# Patient Record
Sex: Male | Born: 1960 | Race: Black or African American | Hispanic: No | Marital: Single | Smoking: Current every day smoker
Health system: Southern US, Community
[De-identification: ages and names within clinical notes are randomized; demographics above are authoritative.]

## PROBLEM LIST (undated history)

## (undated) DIAGNOSIS — H547 Unspecified visual loss: Secondary | ICD-10-CM

## (undated) DIAGNOSIS — E119 Type 2 diabetes mellitus without complications: Secondary | ICD-10-CM

## (undated) DIAGNOSIS — H52209 Unspecified astigmatism, unspecified eye: Secondary | ICD-10-CM

## (undated) HISTORY — DX: Unspecified astigmatism, unspecified eye: H52.209

## (undated) HISTORY — DX: Unspecified visual loss: H54.7

---

## 1997-08-11 ENCOUNTER — Emergency Department (HOSPITAL_COMMUNITY): Admission: EM | Admit: 1997-08-11 | Discharge: 1997-08-11 | Payer: Self-pay | Admitting: Emergency Medicine

## 1999-03-25 ENCOUNTER — Emergency Department (HOSPITAL_COMMUNITY): Admission: EM | Admit: 1999-03-25 | Discharge: 1999-03-25 | Payer: Self-pay | Admitting: *Deleted

## 1999-11-22 ENCOUNTER — Emergency Department (HOSPITAL_COMMUNITY): Admission: EM | Admit: 1999-11-22 | Discharge: 1999-11-23 | Payer: Self-pay | Admitting: Emergency Medicine

## 2002-04-12 ENCOUNTER — Encounter: Payer: Self-pay | Admitting: Emergency Medicine

## 2002-04-12 ENCOUNTER — Emergency Department (HOSPITAL_COMMUNITY): Admission: EM | Admit: 2002-04-12 | Discharge: 2002-04-12 | Payer: Self-pay | Admitting: Emergency Medicine

## 2003-05-30 ENCOUNTER — Ambulatory Visit (HOSPITAL_BASED_OUTPATIENT_CLINIC_OR_DEPARTMENT_OTHER): Admission: RE | Admit: 2003-05-30 | Discharge: 2003-05-30 | Payer: Self-pay | Admitting: Orthopedic Surgery

## 2005-06-17 ENCOUNTER — Emergency Department (HOSPITAL_COMMUNITY): Admission: EM | Admit: 2005-06-17 | Discharge: 2005-06-17 | Payer: Self-pay | Admitting: Emergency Medicine

## 2005-11-13 ENCOUNTER — Emergency Department (HOSPITAL_COMMUNITY): Admission: EM | Admit: 2005-11-13 | Discharge: 2005-11-13 | Payer: Self-pay | Admitting: Family Medicine

## 2006-10-01 DIAGNOSIS — H52209 Unspecified astigmatism, unspecified eye: Secondary | ICD-10-CM

## 2006-10-01 DIAGNOSIS — H547 Unspecified visual loss: Secondary | ICD-10-CM

## 2006-10-01 HISTORY — DX: Unspecified visual loss: H54.7

## 2006-10-01 HISTORY — DX: Unspecified astigmatism, unspecified eye: H52.209

## 2014-02-23 DIAGNOSIS — E119 Type 2 diabetes mellitus without complications: Secondary | ICD-10-CM

## 2014-02-23 HISTORY — DX: Type 2 diabetes mellitus without complications: E11.9

## 2014-02-26 ENCOUNTER — Encounter (HOSPITAL_COMMUNITY): Payer: Self-pay | Admitting: Cardiology

## 2014-02-26 ENCOUNTER — Emergency Department (HOSPITAL_COMMUNITY)
Admission: EM | Admit: 2014-02-26 | Discharge: 2014-02-26 | Disposition: A | Discharge status: Home / Self Care | Payer: Self-pay | Attending: Emergency Medicine | Admitting: Emergency Medicine

## 2014-02-26 DIAGNOSIS — R509 Fever, unspecified: Secondary | ICD-10-CM | POA: Insufficient documentation

## 2014-02-26 DIAGNOSIS — R739 Hyperglycemia, unspecified: Secondary | ICD-10-CM | POA: Insufficient documentation

## 2014-02-26 DIAGNOSIS — Z79899 Other long term (current) drug therapy: Secondary | ICD-10-CM | POA: Insufficient documentation

## 2014-02-26 DIAGNOSIS — R197 Diarrhea, unspecified: Secondary | ICD-10-CM | POA: Insufficient documentation

## 2014-02-26 DIAGNOSIS — Z720 Tobacco use: Secondary | ICD-10-CM | POA: Insufficient documentation

## 2014-02-26 DIAGNOSIS — R112 Nausea with vomiting, unspecified: Secondary | ICD-10-CM | POA: Insufficient documentation

## 2014-02-26 DIAGNOSIS — R1084 Generalized abdominal pain: Secondary | ICD-10-CM | POA: Insufficient documentation

## 2014-02-26 LAB — COMPREHENSIVE METABOLIC PANEL
ALT: 38 U/L (ref 0–53)
AST: 31 U/L (ref 0–37)
Albumin: 4.8 g/dL (ref 3.5–5.2)
Alkaline Phosphatase: 98 U/L (ref 39–117)
Anion gap: 15 (ref 5–15)
BUN: 14 mg/dL (ref 6–23)
CO2: 21 mmol/L (ref 19–32)
Calcium: 10 mg/dL (ref 8.4–10.5)
Chloride: 100 mEq/L (ref 96–112)
Creatinine, Ser: 1.36 mg/dL — ABNORMAL HIGH (ref 0.50–1.35)
GFR calc Af Amer: 68 mL/min — ABNORMAL LOW (ref >90)
GFR, EST NON AFRICAN AMERICAN: 59 mL/min — AB (ref >90)
Glucose, Bld: 550 mg/dL — ABNORMAL HIGH (ref 70–99)
Potassium: 4.4 mmol/L (ref 3.5–5.1)
Sodium: 136 mmol/L (ref 135–145)
Total Bilirubin: 1.3 mg/dL — ABNORMAL HIGH (ref 0.3–1.2)
Total Protein: 8.5 g/dL — ABNORMAL HIGH (ref 6.0–8.3)

## 2014-02-26 LAB — URINALYSIS, ROUTINE W REFLEX MICROSCOPIC
Bilirubin Urine: NEGATIVE
Hgb urine dipstick: NEGATIVE
Ketones, ur: 40 mg/dL — AB
LEUKOCYTES UA: NEGATIVE
Nitrite: NEGATIVE
Protein, ur: NEGATIVE mg/dL
Specific Gravity, Urine: 1.042 — ABNORMAL HIGH (ref 1.005–1.030)
Urobilinogen, UA: 0.2 mg/dL (ref 0.0–1.0)
pH: 5 (ref 5.0–8.0)

## 2014-02-26 LAB — CBC WITH DIFFERENTIAL/PLATELET
BASOS ABS: 0 10*3/uL (ref 0.0–0.1)
Basophils Relative: 0 % (ref 0–1)
EOS ABS: 0 10*3/uL (ref 0.0–0.7)
Eosinophils Relative: 0 % (ref 0–5)
HEMATOCRIT: 49.6 % (ref 39.0–52.0)
Hemoglobin: 17.9 g/dL — ABNORMAL HIGH (ref 13.0–17.0)
Lymphocytes Relative: 16 % (ref 12–46)
Lymphs Abs: 1.7 10*3/uL (ref 0.7–4.0)
MCH: 31.7 pg (ref 26.0–34.0)
MCHC: 36.1 g/dL — ABNORMAL HIGH (ref 30.0–36.0)
MCV: 87.9 fL (ref 78.0–100.0)
MONO ABS: 0.6 10*3/uL (ref 0.1–1.0)
Monocytes Relative: 6 % (ref 3–12)
Neutro Abs: 8.1 10*3/uL — ABNORMAL HIGH (ref 1.7–7.7)
Neutrophils Relative %: 78 % — ABNORMAL HIGH (ref 43–77)
Platelets: 253 10*3/uL (ref 150–400)
RBC: 5.64 MIL/uL (ref 4.22–5.81)
RDW: 12.2 % (ref 11.5–15.5)
WBC: 10.5 10*3/uL (ref 4.0–10.5)

## 2014-02-26 LAB — I-STAT CHEM 8, ED
BUN: 18 mg/dL (ref 6–23)
Calcium, Ion: 1.09 mmol/L — ABNORMAL LOW (ref 1.12–1.23)
Chloride: 101 mEq/L (ref 96–112)
Creatinine, Ser: 1.1 mg/dL (ref 0.50–1.35)
Glucose, Bld: 564 mg/dL (ref 70–99)
HEMATOCRIT: 56 % — AB (ref 39.0–52.0)
Hemoglobin: 19 g/dL — ABNORMAL HIGH (ref 13.0–17.0)
Potassium: 4.4 mmol/L (ref 3.5–5.1)
Sodium: 138 mmol/L (ref 135–145)
TCO2: 19 mmol/L (ref 0–100)

## 2014-02-26 LAB — CBG MONITORING, ED
GLUCOSE-CAPILLARY: 418 mg/dL — AB (ref 70–99)
Glucose-Capillary: 338 mg/dL — ABNORMAL HIGH (ref 70–99)
Glucose-Capillary: 347 mg/dL — ABNORMAL HIGH (ref 70–99)

## 2014-02-26 LAB — URINE MICROSCOPIC-ADD ON

## 2014-02-26 LAB — LIPASE, BLOOD: Lipase: 28 U/L (ref 11–59)

## 2014-02-26 MED ORDER — SODIUM CHLORIDE 0.9 % IV BOLUS (SEPSIS)
1000.0000 mL | Freq: Once | INTRAVENOUS | Status: AC
  Administered 2014-02-26: 1000 mL via INTRAVENOUS

## 2014-02-26 MED ORDER — ONDANSETRON HCL 4 MG/2ML IJ SOLN
4.0000 mg | Freq: Once | INTRAMUSCULAR | Status: AC
  Administered 2014-02-26: 4 mg via INTRAVENOUS
  Filled 2014-02-26: qty 2

## 2014-02-26 MED ORDER — OXYCODONE-ACETAMINOPHEN 5-325 MG PO TABS
2.0000 | ORAL_TABLET | Freq: Once | ORAL | Status: AC
  Administered 2014-02-26: 2 via ORAL
  Filled 2014-02-26: qty 2

## 2014-02-26 MED ORDER — BLOOD GLUC METER DISP-STRIPS DEVI: 1.0000 | Freq: Three times a day (TID) | Status: DC

## 2014-02-26 MED ORDER — METFORMIN HCL 500 MG PO TABS: 500.0000 mg | ORAL_TABLET | Freq: Two times a day (BID) | ORAL | Status: DC

## 2014-02-26 NOTE — ED Notes (Signed)
PA at bedside.

## 2014-02-26 NOTE — Discharge Planning (Signed)
Rokia Bosket J. Clydene Laming, RN, [place], [place] [phone] ED CM consulted to meet with patient concerning f/u care with PCP and patient does not have insurance. Pt presented to [facility] ED today with diarrhea, cough, abd pain, CBG >550.  Met with patient at bedside, confirmed informaton. Pt  reports not having access to f/u care with PCP, or insurance coverage. Discussed with patient importance and benefits of  establishing PCP, and not utilizing the ED for primary care needs. Pt verbalized understanding and is in agreement. Discussed other options, provided list of local  affordable PCPs.  Pt voiced  Interest in the [facility].  [facility] Brochure given with address, phone number, and the services highlighted. Explained that there is a Customer service manager on site who will assist with [employer] and process. Instructed to call or walk in Monday - Friday between the hours of 9- 5:30p to schedule appt for establishing care for PCP. Pt verbalized understanding.

## 2014-02-26 NOTE — ED Provider Notes (Signed)
CSN: [id number]     Arrival date & time 02/26/14  1026 History   First MD Initiated Contact with Patient 02/26/14 1052     Chief Complaint  Patient presents with  . Diarrhea  . Cough  . Abdominal Pain     (Consider location/radiation/quality/duration/timing/severity/associated sxs/prior Treatment) HPI Comments: 54 year old male with no significant past medical history presenting with generalized abdominal pain, nausea, vomiting and diarrhea 2 days. Patient describes the abdominal pain as cramping and intermittent. No specific aggravating or alleviating factors. States he has been unable to keep anything down. Admits to one episode of nonbloody diarrhea one day ago. He endorses subjective fever and chills. Patient states recently he's been drinking about two to liter sodas daily and waking up hourly at night to urinate. Denies dysuria or urgency. Denies chest pain or shortness of breath. Admits to family hx of diabetes.  Patient is a 54 y.o. male presenting with diarrhea, cough, and abdominal pain. The history is provided by the patient.  Diarrhea Associated symptoms: abdominal pain, chills, fever and vomiting   Cough Associated symptoms: chills and fever   Abdominal Pain Associated symptoms: chills, cough, diarrhea, fever, nausea and vomiting     History reviewed. No pertinent past medical history. History reviewed. No pertinent past surgical history. History reviewed. No pertinent family history. History  Substance Use Topics  . Smoking status: Current Every Day Smoker  . Smokeless tobacco: Not on file  . Alcohol Use: Yes    Review of Systems  Constitutional: Positive for fever and chills.  Respiratory: Positive for cough.   Gastrointestinal: Positive for nausea, vomiting, abdominal pain and diarrhea.  Endocrine: Positive for polydipsia and polyuria.  All other systems reviewed and are negative.     Allergies  Review of patient's allergies indicates no known  allergies.  Home Medications   Prior to Admission medications   Medication Sig Start Date End Date Taking? Authorizing Provider  aspirin-sod bicarb-citric acid (ALKA-SELTZER) 325 MG TBEF tablet Take 325 mg by mouth every 6 (six) hours as needed.   Yes Historical Provider, MD  dextromethorphan-guaiFENesin (MUCINEX DM) 30-600 MG per 12 hr tablet Take 1 tablet by mouth 2 (two) times daily.   Yes Historical Provider, MD  guaifenesin (ROBITUSSIN) 100 MG/5ML syrup Take 200 mg by mouth 3 (three) times daily as needed for cough.   Yes Historical Provider, MD  Blood Gluc Meter Disp-Strips (BLOOD GLUCOSE METER DISPOSABLE) DEVI 1 each by Does not apply route 4 (four) times daily - after meals and at bedtime. 02/26/14   Kathrynn Speed, PA-C  metFORMIN (GLUCOPHAGE) 500 MG tablet Take 1 tablet (500 mg total) by mouth 2 (two) times daily with a meal. 02/26/14   Jannifer Fischler M Jeaneen Cala, PA-C   BP 109/72 mmHg  Pulse 74  Temp(Src) 98.4 F (36.9 C) (Oral)  Resp 12  Ht  (1.676 m)  Wt 223 lb (101.152 kg)  BMI 36.01 kg/m2  SpO2 99% Physical Exam  Constitutional: He is oriented to person, place, and time. He appears well-developed and well-nourished. No distress.  HENT:  Head: Normocephalic and atraumatic.  Mouth/Throat: Oropharynx is clear and moist.  Eyes: Conjunctivae and EOM are normal. Pupils are equal, round, and reactive to light.  Neck: Normal range of motion. Neck supple.  Cardiovascular: Normal rate, regular rhythm and normal heart sounds.   Pulmonary/Chest: Effort normal and breath sounds normal.  Abdominal: Soft. Bowel sounds are normal.  Generalized abdominal tenderness. No guarding, rigidity or rebound. No peritoneal signs.  Musculoskeletal: Normal range of motion. He exhibits no edema.  Neurological: He is alert and oriented to person, place, and time.  Skin: Skin is warm and dry. He is not diaphoretic.  Psychiatric: He has a normal mood and affect. His behavior is normal.    ED Course  Procedures  (including critical care time) Labs Review Labs Reviewed  CBC WITH DIFFERENTIAL - Abnormal; Notable for the following:    Hemoglobin 17.9 (*)    MCHC 36.1 (*)    Neutrophils Relative % 78 (*)    Neutro Abs 8.1 (*)    All other components within normal limits  COMPREHENSIVE METABOLIC PANEL - Abnormal; Notable for the following:    Glucose, Bld 550 (*)    Creatinine, Ser 1.36 (*)    Total Protein 8.5 (*)    Total Bilirubin 1.3 (*)    GFR calc non Af Amer 59 (*)    GFR calc Af Amer 68 (*)    All other components within normal limits  URINALYSIS, ROUTINE W REFLEX MICROSCOPIC - Abnormal; Notable for the following:    Specific Gravity, Urine 1.042 (*)    Glucose, UA >1000 (*)    Ketones, ur 40 (*)    All other components within normal limits  CBG MONITORING, ED - Abnormal; Notable for the following:    Glucose-Capillary 418 (*)    All other components within normal limits  CBG MONITORING, ED - Abnormal; Notable for the following:    Glucose-Capillary 347 (*)    All other components within normal limits  I-STAT CHEM 8, ED - Abnormal; Notable for the following:    Glucose, Bld 564 (*)    Calcium, Ion 1.09 (*)    Hemoglobin 19.0 (*)    HCT 56.0 (*)    All other components within normal limits  CBG MONITORING, ED - Abnormal; Notable for the following:    Glucose-Capillary 338 (*)    All other components within normal limits  LIPASE, BLOOD  URINE MICROSCOPIC-ADD ON  CBG MONITORING, ED    Imaging Review No results found.   EKG Interpretation None      MDM   Final diagnoses:  Hyperglycemia  Non-intractable vomiting with nausea, vomiting of unspecified type  Diarrhea  Generalized abdominal pain   Pt in NAD. AFVSS. CBG 564 on arrival. No DKA. Abdomen soft, no peritoneal signs. CBG significant improvement after 2 L fluids to 338. Pt has been drinking two 2-L sodas daily along with drinking orange juice. I discussed dietary changes with pt as most likely new-onset DM.  Tolerates PO in ED. No emesis. Will d/c with rx for metformin, glucometer and test strips. Resources given for f/u. Return precautions given. Patient states understanding of treatment care plan and is agreeable.  Kathrynn Speed, PA-C 02/26/14 1421  Merrie Roof, MD 02/27/14 [phone]

## 2014-02-26 NOTE — ED Notes (Signed)
Tech notified this nurse that the pts glucose level was 511

## 2014-02-26 NOTE — ED Notes (Signed)
Results of chem 8 given to [place], [place]

## 2014-02-26 NOTE — Discharge Instructions (Signed)
Begin taking metformin daily as prescribed. Follow up with the wellness clinic or one of the resources below to establish care with a primary care doctor as soon as possible for further management of your elevated blood sugar.  Blood Glucose Monitoring Monitoring your blood glucose (also know as blood sugar) helps you to manage your diabetes. It also helps you and your health care provider monitor your diabetes and determine how well your treatment plan is working. WHY SHOULD YOU MONITOR YOUR BLOOD GLUCOSE?  It can help you understand how food, exercise, and medicine affect your blood glucose.  It allows you to know what your blood glucose is at any given moment. You can quickly tell if you are having low blood glucose (hypoglycemia) or high blood glucose (hyperglycemia).  It can help you and your health care provider know how to adjust your medicines.  It can help you understand how to manage an illness or adjust medicine for exercise. WHEN SHOULD YOU TEST? Your health care provider will help you decide how often you should check your blood glucose. This may depend on the type of diabetes you have, your diabetes control, or the types of medicines you are taking. Be sure to write down all of your blood glucose readings so that this information can be reviewed with your health care provider. See below for examples of testing times that your health care provider may suggest. Type 1 Diabetes  Test 4 times a day if you are in good control, using an insulin pump, or perform multiple daily injections.  If your diabetes is not well controlled or if you are sick, you may need to monitor more often.  It is a good idea to also monitor:  Before and after exercise.  Between meals and 2 hours after a meal.  Occasionally between 2:00 a.m. and 3:00 a.m. Type 2 Diabetes  It can vary with each person, but generally, if you are on insulin, test 4 times a day.  If you take medicines by mouth (orally),  test 2 times a day.  If you are on a controlled diet, test once a day.  If your diabetes is not well controlled or if you are sick, you may need to monitor more often. HOW TO MONITOR YOUR BLOOD GLUCOSE Supplies Needed  Blood glucose meter.  Test strips for your meter. Each meter has its own strips. You must use the strips that go with your own meter.  A pricking needle (lancet).  A device that holds the lancet (lancing device).  A journal or log book to write down your results. Procedure  Wash your hands with soap and water. Alcohol is not preferred.  Prick the side of your finger (not the tip) with the lancet.  Gently milk the finger until a small drop of blood appears.  Follow the instructions that come with your meter for inserting the test strip, applying blood to the strip, and using your blood glucose meter. Other Areas to Get Blood for Testing Some meters allow you to use other areas of your body (other than your finger) to test your blood. These areas are called alternative sites. The most common alternative sites are:  The forearm.  The thigh.  The back area of the lower leg.  The palm of the hand. The blood flow in these areas is slower. Therefore, the blood glucose values you get may be delayed, and the numbers are different from what you would get from your fingers. Do not use  alternative sites if you think you are having hypoglycemia. Your reading will not be accurate. Always use a finger if you are having hypoglycemia. Also, if you cannot feel your lows (hypoglycemia unawareness), always use your fingers for your blood glucose checks. ADDITIONAL TIPS FOR GLUCOSE MONITORING  Do not reuse lancets.  Always carry your supplies with you.  All blood glucose meters have a 24-hour "hotline" number to call if you have questions or need help.  Adjust (calibrate) your blood glucose meter with a control solution after finishing a few boxes of strips. BLOOD GLUCOSE  RECORD KEEPING It is a good idea to keep a daily record or log of your blood glucose readings. Most glucose meters, if not all, keep your glucose records stored in the meter. Some meters come with the ability to download your records to your home computer. Keeping a record of your blood glucose readings is especially helpful if you are wanting to look for patterns. Make notes to go along with the blood glucose readings because you might forget what happened at that exact time. Keeping good records helps you and your health care provider to work together to achieve good diabetes management.  Document Released: 02/12/2003 Document Revised: 06/26/2013 Document Reviewed: 07/04/2012 [facility] Patient Information 2015 [place], [place]. This information is not intended to replace advice given to you by your health care provider. Make sure you discuss any questions you have with your health care provider.  Diabetes and Exercise Exercising regularly is important. It is not just about losing weight. It has many health benefits, such as:  Improving your overall fitness, flexibility, and endurance.  Increasing your bone density.  Helping with weight control.  Decreasing your body fat.  Increasing your muscle strength.  Reducing stress and tension.  Improving your overall health. People with diabetes who exercise gain additional benefits because exercise:  Reduces appetite.  Improves the body's use of blood sugar (glucose).  Helps lower or control blood glucose.  Decreases blood pressure.  Helps control blood lipids (such as cholesterol and triglycerides).  Improves the body's use of the hormone insulin by:  Increasing the body's insulin sensitivity.  Reducing the body's insulin needs.  Decreases the risk for heart disease because exercising:  Lowers cholesterol and triglycerides levels.  Increases the levels of good cholesterol (such as high-density lipoproteins [HDL]) in the  body.  Lowers blood glucose levels. YOUR ACTIVITY PLAN  Choose an activity that you enjoy and set realistic goals. Your health care provider or diabetes educator can help you make an activity plan that works for you. Exercise regularly as directed by your health care provider. This includes:  Performing resistance training twice a week such as push-ups, sit-ups, lifting weights, or using resistance bands.  Performing 150 minutes of cardio exercises each week such as walking, running, or playing sports.  Staying active and spending no more than 90 minutes at one time being inactive. Even short bursts of exercise are good for you. Three 10-minute sessions spread throughout the day are just as beneficial as a single 30-minute session. Some exercise ideas include:  Taking the dog for a walk.  Taking the stairs instead of the elevator.  Dancing to your favorite song.  Doing an exercise video.  Doing your favorite exercise with a friend. RECOMMENDATIONS FOR EXERCISING WITH TYPE 1 OR TYPE 2 DIABETES   Check your blood glucose before exercising. If blood glucose levels are greater than 240 mg/dL, check for urine ketones. Do not exercise if ketones are  present.  Avoid injecting insulin into areas of the body that are going to be exercised. For example, avoid injecting insulin into:  The arms when playing tennis.  The legs when jogging.  Keep a record of:  Food intake before and after you exercise.  Expected peak times of insulin action.  Blood glucose levels before and after you exercise.  The type and amount of exercise you have done.  Review your records with your health care provider. Your health care provider will help you to develop guidelines for adjusting food intake and insulin amounts before and after exercising.  If you take insulin or oral hypoglycemic agents, watch for signs and symptoms of hypoglycemia. They  include:  Dizziness.  Shaking.  Sweating.  Chills.  Confusion.  Drink plenty of water while you exercise to prevent dehydration or heat stroke. Body water is lost during exercise and must be replaced.  Talk to your health care provider before starting an exercise program to make sure it is safe for you. Remember, almost any type of activity is better than none. Document Released: 05/02/2003 Document Revised: 06/26/2013 Document Reviewed: 07/19/2012 [facility] Patient Information 2015 [place], [place]. This information is not intended to replace advice given to you by your health care provider. Make sure you discuss any questions you have with your health care provider.  Diabetes Mellitus and Food It is important for you to manage your blood sugar (glucose) level. Your blood glucose level can be greatly affected by what you eat. Eating healthier foods in the appropriate amounts throughout the day at about the same time each day will help you control your blood glucose level. It can also help slow or prevent worsening of your diabetes mellitus. Healthy eating may even help you improve the level of your blood pressure and reach or maintain a healthy weight.  HOW CAN FOOD AFFECT ME? Carbohydrates Carbohydrates affect your blood glucose level more than any other type of food. Your dietitian will help you determine how many carbohydrates to eat at each meal and teach you how to count carbohydrates. Counting carbohydrates is important to keep your blood glucose at a healthy level, especially if you are using insulin or taking certain medicines for diabetes mellitus. Alcohol Alcohol can cause sudden decreases in blood glucose (hypoglycemia), especially if you use insulin or take certain medicines for diabetes mellitus. Hypoglycemia can be a life-threatening condition. Symptoms of hypoglycemia (sleepiness, dizziness, and disorientation) are similar to symptoms of having too much alcohol.  If your health  care provider has given you approval to drink alcohol, do so in moderation and use the following guidelines:  Women should not have more than one drink per day, and men should not have more than two drinks per day. One drink is equal to:  12 oz of beer.  5 oz of wine.  1 oz of hard liquor.  Do not drink on an empty stomach.  Keep yourself hydrated. Have water, diet soda, or unsweetened iced tea.  Regular soda, juice, and other mixers might contain a lot of carbohydrates and should be counted. WHAT FOODS ARE NOT RECOMMENDED? As you make food choices, it is important to remember that all foods are not the same. Some foods have fewer nutrients per serving than other foods, even though they might have the same number of calories or carbohydrates. It is difficult to get your body what it needs when you eat foods with fewer nutrients. Examples of foods that you should avoid that are high  in calories and carbohydrates but low in nutrients include:  Trans fats (most processed foods list trans fats on the Nutrition Facts label).  Regular soda.  Juice.  Candy.  Sweets, such as cake, pie, doughnuts, and cookies.  Fried foods. WHAT FOODS CAN I EAT? Have nutrient-rich foods, which will nourish your body and keep you healthy. The food you should eat also will depend on several factors, including:  The calories you need.  The medicines you take.  Your weight.  Your blood glucose level.  Your blood pressure level.  Your cholesterol level. You also should eat a variety of foods, including:  Protein, such as meat, poultry, fish, tofu, nuts, and seeds (lean animal proteins are best).  Fruits.  Vegetables.  Dairy products, such as milk, cheese, and yogurt (low fat is best).  Breads, grains, pasta, cereal, rice, and beans.  Fats such as olive oil, trans fat-free margarine, canola oil, avocado, and olives. DOES EVERYONE WITH DIABETES MELLITUS HAVE THE SAME MEAL PLAN? Because every  person with diabetes mellitus is different, there is not one meal plan that works for everyone. It is very important that you meet with a dietitian who will help you create a meal plan that is just right for you. Document Released: 11/06/2004 Document Revised: 02/14/2013 Document Reviewed: 01/06/2013 [facility] Patient Information 2015 [place], [place]. This information is not intended to replace advice given to you by your health care provider. Make sure you discuss any questions you have with your health care provider.  Hyperglycemia Hyperglycemia occurs when the glucose (sugar) in your blood is too high. Hyperglycemia can happen for many reasons, but it most often happens to people who do not know they have diabetes or are not managing their diabetes properly.  CAUSES  Whether you have diabetes or not, there are other causes of hyperglycemia. Hyperglycemia can occur when you have diabetes, but it can also occur in other situations that you might not be as aware of, such as: Diabetes  If you have diabetes and are having problems controlling your blood glucose, hyperglycemia could occur because of some of the following reasons:  Not following your meal plan.  Not taking your diabetes medications or not taking it properly.  Exercising less or doing less activity than you normally do.  Being sick. Pre-diabetes  This cannot be ignored. Before people develop Type 2 diabetes, they almost always have "pre-diabetes." This is when your blood glucose levels are higher than normal, but not yet high enough to be diagnosed as diabetes. Research has shown that some long-term damage to the body, especially the heart and circulatory system, may already be occurring during pre-diabetes. If you take action to manage your blood glucose when you have pre-diabetes, you may delay or prevent Type 2 diabetes from developing. Stress  If you have diabetes, you may be "diet" controlled or on oral medications or insulin  to control your diabetes. However, you may find that your blood glucose is higher than usual in the hospital whether you have diabetes or not. This is often referred to as "stress hyperglycemia." Stress can elevate your blood glucose. This happens because of hormones put out by the body during times of stress. If stress has been the cause of your high blood glucose, it can be followed regularly by your caregiver. That way he/she can make sure your hyperglycemia does not continue to get worse or progress to diabetes. Steroids  Steroids are medications that act on the infection fighting system (immune system)  to block inflammation or infection. One side effect can be a rise in blood glucose. Most people can produce enough extra insulin to allow for this rise, but for those who cannot, steroids make blood glucose levels go even higher. It is not unusual for steroid treatments to "uncover" diabetes that is developing. It is not always possible to determine if the hyperglycemia will go away after the steroids are stopped. A special blood test called an A1c is sometimes done to determine if your blood glucose was elevated before the steroids were started. SYMPTOMS  Thirsty.  Frequent urination.  Dry mouth.  Blurred vision.  Tired or fatigue.  Weakness.  Sleepy.  Tingling in feet or leg. DIAGNOSIS  Diagnosis is made by monitoring blood glucose in one or all of the following ways:  A1c test. This is a chemical found in your blood.  Fingerstick blood glucose monitoring.  Laboratory results. TREATMENT  First, knowing the cause of the hyperglycemia is important before the hyperglycemia can be treated. Treatment may include, but is not be limited to:  Education.  Change or adjustment in medications.  Change or adjustment in meal plan.  Treatment for an illness, infection, etc.  More frequent blood glucose monitoring.  Change in exercise plan.  Decreasing or stopping  steroids.  Lifestyle changes. HOME CARE INSTRUCTIONS   Test your blood glucose as directed.  Exercise regularly. Your caregiver will give you instructions about exercise. Pre-diabetes or diabetes which comes on with stress is helped by exercising.  Eat wholesome, balanced meals. Eat often and at regular, fixed times. Your caregiver or nutritionist will give you a meal plan to guide your sugar intake.  Being at an ideal weight is important. If needed, losing as little as 10 to 15 pounds may help improve blood glucose levels. SEEK MEDICAL CARE IF:   You have questions about medicine, activity, or diet.  You continue to have symptoms (problems such as increased thirst, urination, or weight gain). SEEK IMMEDIATE MEDICAL CARE IF:   You are vomiting or have diarrhea.  Your breath smells fruity.  You are breathing faster or slower.  You are very sleepy or incoherent.  You have numbness, tingling, or pain in your feet or hands.  You have chest pain.  Your symptoms get worse even though you have been following your caregiver's orders.  If you have any other questions or concerns. Document Released: 08/05/2000 Document Revised: 05/04/2011 Document Reviewed: 06/08/2011 [facility] Patient Information 2015 [place], [place]. This information is not intended to replace advice given to you by your health care provider. Make sure you discuss any questions you have with your health care provider. RESOURCE GUIDE  Chronic Pain Problems: Contact [facility]  [phone] Patients need to be referred by their primary care doctor.  Insufficient Money for Medicine: Contact [employer]:  call "211."   No Primary Care Doctor: - Call [facility]  [phone] - can help you locate a primary care doctor that  accepts your insurance, provides certain services, etc. - Physician Referral [phone]  Agencies that provide inexpensive medical care: - [facility]  [phone] - [facility]  [phone] - [facility]  [phone] - [facility]  [phone] - [facility]  [phone] - [facility]  [phone]  Medicaid-accepting [facility] Providers: - [facility]- [address]  [phone], Mon-Fri 9am-7pm, Sat 9am-1pm - [facility]- [address] [place], [address]  [phone] - [facility]- [address], [address] [place]  [phone] [facility]- [address]  [phone] - Renaye Rakers- [address] [place], [address], [phone]  Only accepts [place] patients after they have their name  applied to their card  Self Pay (no insurance) in [facility]: - Sickle Cell Patients: Dr Willey Blade, [facility]  [address] [place], [phone] - [facility]- [address] [place]  [phone]       [facility] [place] [facility]- [address]       -     [facility]- see information above (Speak to [employer] if you do not have insurance)       -  [facility]- [address],  [phone]       -  [facility]- [address], [phone]       -  Dr Julio Sicks-  [address], [place], [phone]       -  [facility] - [address] [phone]       -  [facility]- [address] [phone], also [address], [phone]       -    [facility]- [address] [place], [phone], 1st & 3rd Saturday        every month, 10am-1pm  1) Find a Doctor and Pay Out of Pocket Although you won't have to find out who is covered by your insurance plan, it is a good idea to ask around and get recommendations. You will then need to call the office and see if the doctor you have chosen will accept you as a new patient and what types of options they offer for patients who are  self-pay. Some doctors offer discounts or will set up payment plans for their patients who do not have insurance, but you will need to ask so you aren't surprised when you get to your appointment.  2) Contact Your Local Health Department Not all health departments have doctors that can see patients for sick visits, but many do, so it is worth a call to see if yours does. If you don't know where your local health department is, you can check in your phone book. The CDC also has a tool to help you locate your state's health department, and many state websites also have listings of all of their local health departments.  3) Find a Walk-in Clinic If your illness is not likely to be very severe or complicated, you may want to try a walk in clinic. These are popping up all over the country in pharmacies, drugstores, and shopping centers. They're usually staffed by nurse practitioners or physician assistants that have been trained to treat common illnesses and complaints. They're usually fairly quick and inexpensive. However, if you have serious medical issues or chronic medical problems, these are probably not your best option

## 2014-02-26 NOTE — ED Notes (Signed)
Pt given urinal.

## 2014-02-26 NOTE — ED Notes (Signed)
Pt reports that he has had n/v/d for the past couple of days. Reports that he has also increased fluid intake. Pt reports a hx of DM in his family.

## 2014-02-26 NOTE — ED Notes (Signed)
Robyn, PA at the bedside.  

## 2014-03-01 ENCOUNTER — Emergency Department (HOSPITAL_COMMUNITY)
Admission: EM | Admit: 2014-03-01 | Discharge: 2014-03-01 | Disposition: A | Discharge status: Home / Self Care | Payer: Self-pay | Attending: Emergency Medicine | Admitting: Emergency Medicine

## 2014-03-01 ENCOUNTER — Encounter (HOSPITAL_COMMUNITY): Payer: Self-pay | Admitting: Cardiology

## 2014-03-01 DIAGNOSIS — E119 Type 2 diabetes mellitus without complications: Secondary | ICD-10-CM | POA: Insufficient documentation

## 2014-03-01 DIAGNOSIS — Z720 Tobacco use: Secondary | ICD-10-CM | POA: Insufficient documentation

## 2014-03-01 DIAGNOSIS — R197 Diarrhea, unspecified: Secondary | ICD-10-CM | POA: Insufficient documentation

## 2014-03-01 DIAGNOSIS — R1310 Dysphagia, unspecified: Secondary | ICD-10-CM | POA: Insufficient documentation

## 2014-03-01 DIAGNOSIS — R110 Nausea: Secondary | ICD-10-CM | POA: Insufficient documentation

## 2014-03-01 HISTORY — DX: Type 2 diabetes mellitus without complications: E11.9

## 2014-03-01 LAB — CBC WITH DIFFERENTIAL/PLATELET
BASOS ABS: 0 10*3/uL (ref 0.0–0.1)
BASOS PCT: 0 % (ref 0–1)
Eosinophils Absolute: 0.1 10*3/uL (ref 0.0–0.7)
Eosinophils Relative: 1 % (ref 0–5)
HCT: 44.5 % (ref 39.0–52.0)
Hemoglobin: 16.1 g/dL (ref 13.0–17.0)
LYMPHS PCT: 24 % (ref 12–46)
Lymphs Abs: 1.8 10*3/uL (ref 0.7–4.0)
MCH: 32.3 pg (ref 26.0–34.0)
MCHC: 36.2 g/dL — AB (ref 30.0–36.0)
MCV: 89.4 fL (ref 78.0–100.0)
Monocytes Absolute: 0.6 10*3/uL (ref 0.1–1.0)
Monocytes Relative: 8 % (ref 3–12)
NEUTROS ABS: 5 10*3/uL (ref 1.7–7.7)
NEUTROS PCT: 67 % (ref 43–77)
PLATELETS: 216 10*3/uL (ref 150–400)
RBC: 4.98 MIL/uL (ref 4.22–5.81)
RDW: 12.2 % (ref 11.5–15.5)
WBC: 7.5 10*3/uL (ref 4.0–10.5)

## 2014-03-01 LAB — COMPREHENSIVE METABOLIC PANEL
ALK PHOS: 82 U/L (ref 39–117)
ALT: 25 U/L (ref 0–53)
ANION GAP: 11 (ref 5–15)
AST: 23 U/L (ref 0–37)
Albumin: 4.1 g/dL (ref 3.5–5.2)
BUN: 10 mg/dL (ref 6–23)
CALCIUM: 9.3 mg/dL (ref 8.4–10.5)
CO2: 24 mmol/L (ref 19–32)
Chloride: 99 mEq/L (ref 96–112)
Creatinine, Ser: 1.08 mg/dL (ref 0.50–1.35)
GFR calc non Af Amer: 78 mL/min — ABNORMAL LOW (ref >90)
GLUCOSE: 384 mg/dL — AB (ref 70–99)
POTASSIUM: 3.8 mmol/L (ref 3.5–5.1)
Sodium: 134 mmol/L — ABNORMAL LOW (ref 135–145)
Total Bilirubin: 0.8 mg/dL (ref 0.3–1.2)
Total Protein: 7 g/dL (ref 6.0–8.3)

## 2014-03-01 LAB — LIPASE, BLOOD: Lipase: 39 U/L (ref 11–59)

## 2014-03-01 LAB — CBG MONITORING, ED
Glucose-Capillary: 373 mg/dL — ABNORMAL HIGH (ref 70–99)
Glucose-Capillary: 222 mg/dL — ABNORMAL HIGH (ref 70–99)
Glucose-Capillary: 243 mg/dL — ABNORMAL HIGH (ref 70–99)
Glucose-Capillary: 278 mg/dL — ABNORMAL HIGH (ref 70–99)

## 2014-03-01 MED ORDER — GI COCKTAIL ~~LOC~~
30.0000 mL | Freq: Once | ORAL | Status: AC
  Administered 2014-03-01: 30 mL via ORAL
  Filled 2014-03-01: qty 30

## 2014-03-01 MED ORDER — ONDANSETRON HCL 4 MG PO TABS: 4.0000 mg | ORAL_TABLET | Freq: Four times a day (QID) | ORAL | Status: DC

## 2014-03-01 MED ORDER — HYDROCODONE-ACETAMINOPHEN 5-325 MG PO TABS
2.0000 | ORAL_TABLET | Freq: Once | ORAL | Status: AC
  Administered 2014-03-01: 2 via ORAL
  Filled 2014-03-01: qty 2

## 2014-03-01 MED ORDER — INSULIN ASPART 100 UNIT/ML IV SOLN
8.0000 [IU] | Freq: Once | INTRAVENOUS | Status: AC
  Administered 2014-03-01: 8 [IU] via INTRAVENOUS
  Filled 2014-03-01: qty 1

## 2014-03-01 MED ORDER — OMEPRAZOLE 20 MG PO CPDR: 20.0000 mg | DELAYED_RELEASE_CAPSULE | Freq: Every day | ORAL | Status: DC

## 2014-03-01 MED ORDER — SODIUM CHLORIDE 0.9 % IV BOLUS (SEPSIS)
1000.0000 mL | Freq: Once | INTRAVENOUS | Status: AC
  Administered 2014-03-01: 1000 mL via INTRAVENOUS

## 2014-03-01 NOTE — Discharge Instructions (Signed)
Is important for you to follow-up with [place] [facility] for your regularly scheduled appointment on Thursday. Please take your medications as prescribed. It is important for you to get close follow-up for your blood sugar. Return to ED for worsening symptoms, abdominal pain, nausea, vomiting

## 2014-03-01 NOTE — ED Notes (Signed)
Pt states he saw "bright blood in my stool earlier today and i also saw blood in my throw up."

## 2014-03-01 NOTE — ED Notes (Signed)
Pt reports that he has had a sore throat for 2 days and has a burning sensation in his throat when he swallows and feels like the food gets stuck in his chest.

## 2014-03-01 NOTE — ED Provider Notes (Signed)
CSN: [id number]     Arrival date & time 03/01/14  1535 History   First MD Initiated Contact with Patient 03/01/14 1740     Chief Complaint  Patient presents with  . Sore Throat  . Gastrophageal Reflux     (Consider location/radiation/quality/duration/timing/severity/associated sxs/prior Treatment) HPI Matthew Montgomery is a 54 y.o. male with recently diagnosed diabetes comes in for evaluation of difficulty swallowing, nausea and diarrhea for the past 3 days. Patient states since he started metformin 3 days ago he has had increased nausea and diarrhea with some discomfort in his upper abdomen. When he tries to swallow his food he gets sensations that they get stuck in his throat and at the bottom of his chest. He is, however, able to swallow. He also reports when he clears his throat really hard to get mucous out he will sometimes see streaks of blood. Patient also reports that he will drink 40 ounces of beer every other day. Denies fevers, headaches, cp, sob, numbness, weakness, dizziness.  Past Medical History  Diagnosis Date  . Diabetes mellitus without complication    History reviewed. No pertinent past surgical history. History reviewed. No pertinent family history. History  Substance Use Topics  . Smoking status: Current Every Day Smoker  . Smokeless tobacco: Not on file  . Alcohol Use: Yes    Review of Systems A 10 point review of systems was completed and was negative except for pertinent positives and negatives as mentioned in the history of present illness     Allergies  Review of patient's allergies indicates no known allergies.  Home Medications   Prior to Admission medications   Medication Sig Start Date End Date Taking? Authorizing Provider  metFORMIN (GLUCOPHAGE) 500 MG tablet Take 1 tablet (500 mg total) by mouth 2 (two) times daily with a meal. 02/26/14  Yes Robyn M Hess, PA-C  omeprazole (PRILOSEC) 20 MG capsule Take 1 capsule (20 mg total) by mouth daily. 03/01/14    Earle GellBenjamin W Anthoni Geerts, PA-C  ondansetron (ZOFRAN) 4 MG tablet Take 1 tablet (4 mg total) by mouth every 6 (six) hours. 03/01/14   Earle GellBenjamin W Markella Dao, PA-C   BP 114/56 mmHg  Pulse 81  Temp(Src) 98.1 F (36.7 C) (Oral)  Resp 19  SpO2 99% Physical Exam  Constitutional: He is oriented to person, place, and time. He appears well-developed and well-nourished.  HENT:  Head: Normocephalic and atraumatic.  Mouth/Throat: Oropharynx is clear and moist.  Eyes: Conjunctivae are normal. Pupils are equal, round, and reactive to light. Right eye exhibits no discharge. Left eye exhibits no discharge. No scleral icterus.  Neck: Neck supple. No tracheal deviation present.  Cardiovascular: Normal rate, regular rhythm, normal heart sounds and intact distal pulses.   Pulmonary/Chest: Effort normal and breath sounds normal. No respiratory distress. He has no wheezes. He has no rales.  Abdominal: Soft. He exhibits no distension and no mass. There is no tenderness. There is no rebound and no guarding.  Musculoskeletal: Normal range of motion. He exhibits no edema or tenderness.  Neurological: He is alert and oriented to person, place, and time.  Cranial Nerves II-XII grossly intact  Skin: Skin is warm and dry. No rash noted.  Psychiatric: He has a normal mood and affect.  Nursing note and vitals reviewed.   ED Course  Procedures (including critical care time) Labs Review Labs Reviewed  COMPREHENSIVE METABOLIC PANEL - Abnormal; Notable for the following:    Sodium 134 (*)    Glucose, Bld 384 (*)  GFR calc non Af Amer 78 (*)    All other components within normal limits  CBC WITH DIFFERENTIAL - Abnormal; Notable for the following:    MCHC 36.2 (*)    All other components within normal limits  CBG MONITORING, ED - Abnormal; Notable for the following:    Glucose-Capillary 373 (*)    All other components within normal limits  CBG MONITORING, ED - Abnormal; Notable for the following:    Glucose-Capillary 278  (*)    All other components within normal limits  CBG MONITORING, ED - Abnormal; Notable for the following:    Glucose-Capillary 243 (*)    All other components within normal limits  CBG MONITORING, ED - Abnormal; Notable for the following:    Glucose-Capillary 222 (*)    All other components within normal limits  LIPASE, BLOOD    Imaging Review No results found.   EKG Interpretation None     Meds given in ED:  Medications  gi cocktail (Maalox,Lidocaine,Donnatal) (30 mLs Oral Given 03/01/14 1827)  sodium chloride 0.9 % bolus 1,000 mL (0 mLs Intravenous Stopped 03/01/14 2055)  insulin aspart (novoLOG) injection 8 Units (8 Units Intravenous Given 03/01/14 1940)  HYDROcodone-acetaminophen (NORCO/VICODIN) 5-325 MG per tablet 2 tablet (2 tablets Oral Given 03/01/14 1953)    Discharge Medication List as of 03/01/2014  8:51 PM    START taking these medications   Details  omeprazole (PRILOSEC) 20 MG capsule Take 1 capsule (20 mg total) by mouth daily., Starting 03/01/2014, Until Discontinued, Print    ondansetron (ZOFRAN) 4 MG tablet Take 1 tablet (4 mg total) by mouth every 6 (six) hours., Starting 03/01/2014, Until Discontinued, Print       Filed Vitals:   03/01/14 2000 03/01/14 2019 03/01/14 2030 03/01/14 2059  BP: 116/64 119/77 114/56   Pulse: 79 86 81   Temp:    98.1 F (36.7 C)  TempSrc:    Oral  Resp: SpO2: 97% 98% 99%     MDM  No vomiting in ED. Patient is tolerating P0 in the ED without difficulty. GI cocktail improved  Symptoms. Will discharge with PPI and instructions for strict PCP follow-up, has appointment in [facility] and wellness next Thursday.  Vitals stable - WNL -afebrile Pt resting comfortably in ED. GI cocktail improved discomfort. PE--not concerning for other acute or emergent pathology. No evidence of overt or frank blood on exam. Abdominal exam benign. Labwork--glucose decreased to 222 in the ED after IV fluids and 8 units of  insulin.  DDX--patient symptoms likely multifactorial with alcoholic gastritis and GI discomfort associated with new metformin. Low concern for other acute process requiring emergent intervention. Low concern for perf ulcer or acute GI bleed. Will DC with PPI, instructions for alcohol cessation, diet modifications. Discussed f/u with PCP and return precautions, pt very amenable to plan. Prior to patient discharge, I discussed and reviewed this case with Dr.Kohut  Final diagnoses:  Dysphagia       Sharlene Motts, PA-C 03/02/14 1754  Raeford Razor, MD 03/03/14 1434

## 2014-03-08 ENCOUNTER — Ambulatory Visit: Payer: Self-pay | Attending: Family Medicine | Admitting: Family Medicine

## 2014-03-08 ENCOUNTER — Encounter: Payer: Self-pay | Admitting: Family Medicine

## 2014-03-08 VITALS — BP 117/79 | HR 77 | Temp 98.7°F | Resp 16 | Ht 68.0 in | Wt 213.0 lb

## 2014-03-08 DIAGNOSIS — E119 Type 2 diabetes mellitus without complications: Secondary | ICD-10-CM

## 2014-03-08 DIAGNOSIS — Z720 Tobacco use: Secondary | ICD-10-CM

## 2014-03-08 DIAGNOSIS — E1165 Type 2 diabetes mellitus with hyperglycemia: Secondary | ICD-10-CM

## 2014-03-08 DIAGNOSIS — B353 Tinea pedis: Secondary | ICD-10-CM

## 2014-03-08 DIAGNOSIS — K219 Gastro-esophageal reflux disease without esophagitis: Secondary | ICD-10-CM

## 2014-03-08 DIAGNOSIS — R739 Hyperglycemia, unspecified: Secondary | ICD-10-CM

## 2014-03-08 DIAGNOSIS — IMO0002 Reserved for concepts with insufficient information to code with codable children: Secondary | ICD-10-CM

## 2014-03-08 DIAGNOSIS — F17210 Nicotine dependence, cigarettes, uncomplicated: Secondary | ICD-10-CM

## 2014-03-08 LAB — LIPID PANEL
CHOL/HDL RATIO: 6 ratio
Cholesterol: 150 mg/dL (ref 0–200)
HDL: 25 mg/dL — ABNORMAL LOW (ref >39)
LDL Cholesterol: 86 mg/dL (ref 0–99)
Triglycerides: 194 mg/dL — ABNORMAL HIGH (ref <150)
VLDL: 39 mg/dL (ref 0–40)

## 2014-03-08 LAB — POCT URINALYSIS DIPSTICK
BILIRUBIN UA: NEGATIVE
Blood, UA: NEGATIVE
Glucose, UA: 500
KETONES UA: 40
Leukocytes, UA: NEGATIVE
Nitrite, UA: NEGATIVE
Protein, UA: NEGATIVE
UROBILINOGEN UA: 0.2
pH, UA: 5.5

## 2014-03-08 LAB — GLUCOSE, POCT (MANUAL RESULT ENTRY)
POC GLUCOSE: 504 mg/dL — AB (ref 70–99)
POC GLUCOSE: 343 mg/dL — AB (ref 70–99)

## 2014-03-08 LAB — TSH: TSH: 1.238 u[IU]/mL (ref 0.350–4.500)

## 2014-03-08 LAB — POCT GLYCOSYLATED HEMOGLOBIN (HGB A1C): Hemoglobin A1C: 12.2

## 2014-03-08 MED ORDER — OMEPRAZOLE 40 MG PO CPDR: 40.0000 mg | DELAYED_RELEASE_CAPSULE | Freq: Two times a day (BID) | ORAL | Status: DC

## 2014-03-08 MED ORDER — GLUCOSE BLOOD VI STRP: 1.0000 | ORAL_STRIP | Freq: Three times a day (TID) | Status: DC

## 2014-03-08 MED ORDER — METFORMIN HCL ER 500 MG PO TB24: 1000.0000 mg | ORAL_TABLET | Freq: Two times a day (BID) | ORAL | Status: DC

## 2014-03-08 MED ORDER — SUCRALFATE 1 G PO TABS: 1.0000 g | ORAL_TABLET | Freq: Three times a day (TID) | ORAL | Status: DC

## 2014-03-08 MED ORDER — INSULIN ASPART PROT & ASPART (70-30 MIX) 100 UNIT/ML ~~LOC~~ SUSP
10.0000 [IU] | Freq: Once | SUBCUTANEOUS | Status: AC
  Administered 2014-03-08: 10 [IU] via SUBCUTANEOUS

## 2014-03-08 MED ORDER — INSULIN GLARGINE 100 UNIT/ML SOLOSTAR PEN: 10.0000 [IU] | PEN_INJECTOR | Freq: Every day | SUBCUTANEOUS | Status: DC

## 2014-03-08 MED ORDER — SODIUM CHLORIDE 0.9 % IV SOLN
INTRAVENOUS | Status: DC
  Administered 2014-03-08: 11:00:00 via INTRAVENOUS

## 2014-03-08 MED ORDER — TRUERESULT BLOOD GLUCOSE W/DEVICE KIT: 1.0000 | PACK | Freq: Three times a day (TID) | Status: DC

## 2014-03-08 MED ORDER — INSULIN PEN NEEDLE 31G X 8 MM MISC: 1.0000 "application " | Freq: Every day | Status: DC

## 2014-03-08 MED ORDER — TERBINAFINE HCL 1 % EX CREA: 1.0000 "application " | TOPICAL_CREAM | Freq: Two times a day (BID) | CUTANEOUS | Status: DC

## 2014-03-08 MED ORDER — TRUEPLUS LANCETS 28G MISC: 1.0000 | Freq: Three times a day (TID) | Status: DC

## 2014-03-08 NOTE — Progress Notes (Signed)
Documentation error made- This RN gave 10 units Novolog and not 10 units of Novolog 70/30.

## 2014-03-08 NOTE — Progress Notes (Signed)
   Subjective:    Patient ID: Matthew Montgomery, male    DOB: 06/12/1960, 54 y.o.   MRN: [id number] CC: newly diagnosed diabetes, hyperglycemia  HPI 54 yo M presents to establish care:   1. DM type 2: dx in 02/2014. Has been seen in ED twice since 02/26/14 with hyperglycemia, sugar up to 418. He reports polyuria, polydipsia, substernal chest pain, reflux x one year, tingling in fingers of R hand, dry skin especially on feet. GI upset (nauea) since starting metformin IR 500 mg BID. He denies exertional CP, focal neurological deficits, foot tingling or numbness. He drinks alcohol 2-3 timer per week.   Soc Hx: current smoker 3 cigs per day  Surg Hx: negative Fam Hx: DM in mother and father  Review of Systems As per HPI     Objective:   Physical Exam BP 117/79 mmHg  Pulse 77  Temp(Src) 98.7 F (37.1 C)  Resp 16  Ht 5\' 8"  (1.727 m)  Wt 213 lb (96.616 kg)  BMI 32.39 kg/m2  SpO2 99% General appearance: alert, cooperative and no distress Throat: lips, mucosa, and tongue normal; teeth and gums normal Neck: no adenopathy, supple, symmetrical, trachea midline and thyroid not enlarged, symmetric, no tenderness/mass/nodules Lungs: clear to auscultation bilaterally Heart: regular rate and rhythm, S1, S2 normal, no murmur, click, rub or gallop Abdomen: soft, non-tender; bowel sounds normal; no masses,  no organomegaly Extremities: extremities normal, atraumatic, no cyanosis or edema Skin: skin is dry, skin is scaly on plantar foot with maceration between 4th and 5th digit,  Lab Results  Component Value Date   HGBA1C 12.20 03/08/2014   CBG 504, 10 U novolog, and I L NS given.  UA: 40 ketones, 500 glucose F/u CBG 343     Assessment & Plan:

## 2014-03-08 NOTE — Assessment & Plan Note (Signed)
A: tinea pedis P: lamisil cream

## 2014-03-08 NOTE — Patient Instructions (Addendum)
Mr. Matthew Montgomery,  Thank you for coming in today. It was a pleasure meeting you. I look forward to being your primary doctor.  Check and record blood sugar  3 times per day:   Goal fasting 70-130. If your fasting sugar is above 130 increase lantus dose by 2 units from yesterday's dose.  Goal after eating < 200 Beware of hypoglycemia which is blood sugar < 70 with or without symptoms  Beware of hypoglycemia which is blood sugar less than 70 with or without symptoms.  The common symptoms of hypoglycemia are: sweating, pale or dusty skin, excessive fatigue, nausea, jitteriness. If you experience these symptoms please check your blood sugar.  My blood sugar is low  (less than 70). What should I do?  If low 60- 70, with or without symptoms. Do not take insulin or oral medication,  eat or drink carbohydrates right away (juice, sweets, breads, fruit). Recheck blood sugar in 2 hrs. If still low call your doctor. If normal take medication.   If 60-40 without symptoms.  Same as above and call your doctor.   If 60-40 with symptoms. Same as above. If symptoms resolve within 30 minutes of eating or drinking carbohydrates call your doctor. If symptoms persist call 911.   If less than 40 with or without symptoms. Same as above. Do not wait 30 minutes, instead call 911.    Diet Recommendations for Diabetes   Starchy (carb) foods include: Bread, rice, pasta, potatoes, corn, crackers, bagels, muffins, all baked goods.   Protein foods include: Meat, fish, poultry, eggs, dairy foods, and beans such as pinto and kidney beans (beans also provide carbohydrate).   1. Eat at least 3 meals and 1-2 snacks per day. Never go more than 4-5 hours while awake without eating.  2. Limit starchy foods to TWO per meal and ONE per snack. ONE portion of a starchy  food is equal to the following:   - ONE slice of bread (or its equivalent, such as half of a hamburger bun).   - 1/2 cup of a "scoopable" starchy food such as  potatoes or rice.   - 1 OUNCE (28 grams) of starchy snack foods such as crackers or pretzels (look on label).   - 15 grams of carbohydrate as shown on food label.  3. Both lunch and dinner should include a protein food, a carb food, and vegetables.   - Obtain twice as many veg's as protein or carbohydrate foods for both lunch and dinner.   - Try to keep frozen veg's on hand for a quick vegetable serving.     - Fresh or frozen veg's are best.  4. Breakfast should always include protein.     Smoking cessation support: smoking cessation hotline: 1-800-QUIT-NOW.  Smoking cessation classes are available through [facility]. Call [phone] or visit our website at [email or web address].  Apply for [place] discount, orange card and PASS (medication assistance)  F/u in 3 with with nurse for blood sugar review. F/u in 6 weeks with me for type 2 diabetes.   Dr. Armen PickupFunches

## 2014-03-08 NOTE — Assessment & Plan Note (Signed)
A: light smoker P: smoking cessation

## 2014-03-08 NOTE — Assessment & Plan Note (Addendum)
A: GERD  P:  PPI carafate

## 2014-03-08 NOTE — Assessment & Plan Note (Addendum)
A: uncontrolled DM 2 newly dx. P: Metformin 1000 mg XR BID  lantus 10 U q Am with up titration, 2 U daily for fasting CBG > 130 Diabetes education Labs: lipids, A1c, urine microalbumin, TSH  F/u in 3 weeks with RN F/u in 6 weeks with me   Lipids reviewed, urine microalbumin normal: plan to initiate statin and ACEi at f/u appt

## 2014-03-08 NOTE — Progress Notes (Signed)
Establish care Hospital ED 1/4 and 1/77for  N/V abdominal pain CBG on 1/4 587 and on 1/7 it was 383 Patient ate breakfast and has not taken metformin Refusing flu and PNA Colonoscopy 2 years ago it was normal Smokes 3 cigs per day Drinks a 40 oz q 3 days Uses marijuana

## 2014-03-09 ENCOUNTER — Telehealth: Payer: Self-pay | Admitting: *Deleted

## 2014-03-09 ENCOUNTER — Encounter: Payer: Self-pay | Admitting: Family Medicine

## 2014-03-09 ENCOUNTER — Telehealth: Payer: Self-pay | Admitting: Family Medicine

## 2014-03-09 DIAGNOSIS — IMO0002 Reserved for concepts with insufficient information to code with codable children: Secondary | ICD-10-CM

## 2014-03-09 DIAGNOSIS — E1165 Type 2 diabetes mellitus with hyperglycemia: Secondary | ICD-10-CM

## 2014-03-09 LAB — MICROALBUMIN / CREATININE URINE RATIO
CREATININE, URINE: 46.4 mg/dL
MICROALB UR: 0.4 mg/dL (ref <2.0)
Microalb Creat Ratio: 8.6 mg/g (ref 0.0–30.0)

## 2014-03-09 MED ORDER — INSULIN SYRINGES (DISPOSABLE) U-100 0.5 ML MISC: 1.0000 | Freq: Two times a day (BID) | Status: DC

## 2014-03-09 MED ORDER — ATORVASTATIN CALCIUM 40 MG PO TABS: 40.0000 mg | ORAL_TABLET | Freq: Every day | ORAL | Status: DC

## 2014-03-09 MED ORDER — INSULIN ASPART 100 UNIT/ML ~~LOC~~ SOLN: 10.0000 [IU] | Freq: Three times a day (TID) | SUBCUTANEOUS | Status: DC

## 2014-03-09 NOTE — Telephone Encounter (Signed)
-----   Message from Lora PaulaJosalyn C Funches, MD sent at 03/09/2014  9:08 AM EST ----- Normal urine microalbumin Normal TSH Lipids: elevated TGs, low HDL, will start lipitor 40

## 2014-03-09 NOTE — Assessment & Plan Note (Signed)
Please call patient back.  For persistent hyperglycemia Adding short acting insulin Patient to start novolog 10 U with meals Drink plenty of water  novolog is a vial so he will need to draw up the insulin into a syringe. Ask the pharmacist for assistance.  

## 2014-03-09 NOTE — Telephone Encounter (Signed)
Pt aware Rx was send at the pharmacy

## 2014-03-09 NOTE — Addendum Note (Signed)
Addended by: Dessa PhiFUNCHES, Javyon Fontan on: 03/09/2014 05:14 PM   Modules accepted: Orders

## 2014-03-09 NOTE — Telephone Encounter (Signed)
Patient called stating that he has not gotten a prescription for his feet. Patient would like to speak to nurse.

## 2014-03-09 NOTE — Telephone Encounter (Signed)
Error

## 2014-03-09 NOTE — Telephone Encounter (Signed)
Left voice message to return call Rx send to [facility] pharmacy 

## 2014-03-09 NOTE — Telephone Encounter (Signed)
Noted. Patient to take 12 U of lantus tomorrow AM.

## 2014-03-09 NOTE — Telephone Encounter (Signed)
Pt aware of new Rx will pick up tomorrow

## 2014-03-09 NOTE — Telephone Encounter (Signed)
Pt stated glucose been running high, been taking Metformin and Insulin 10 unit as prescribed This morning glucose fasting =405 fasting Before lunch= 149

## 2014-03-09 NOTE — Telephone Encounter (Signed)
Please call patient back.  For persistent hyperglycemia Adding short acting insulin Patient to start novolog 10 U with meals Drink plenty of water  novolog is a vial so he will need to draw up the insulin into a syringe. Ask the pharmacist for assistance.

## 2014-03-22 ENCOUNTER — Ambulatory Visit: Payer: Self-pay

## 2014-03-27 ENCOUNTER — Encounter: Payer: Self-pay | Attending: Family Medicine

## 2014-03-27 VITALS — Ht 69.0 in | Wt 211.0 lb

## 2014-03-27 DIAGNOSIS — Z713 Dietary counseling and surveillance: Secondary | ICD-10-CM | POA: Insufficient documentation

## 2014-03-27 DIAGNOSIS — Z794 Long term (current) use of insulin: Secondary | ICD-10-CM | POA: Insufficient documentation

## 2014-03-27 DIAGNOSIS — IMO0002 Reserved for concepts with insufficient information to code with codable children: Secondary | ICD-10-CM

## 2014-03-27 DIAGNOSIS — E1165 Type 2 diabetes mellitus with hyperglycemia: Secondary | ICD-10-CM | POA: Insufficient documentation

## 2014-03-29 ENCOUNTER — Ambulatory Visit: Payer: Self-pay

## 2014-03-30 NOTE — Progress Notes (Signed)

## 2014-04-03 ENCOUNTER — Ambulatory Visit: Payer: Self-pay

## 2014-04-04 ENCOUNTER — Ambulatory Visit: Payer: Self-pay

## 2014-04-05 ENCOUNTER — Ambulatory Visit: Payer: Self-pay

## 2014-04-06 ENCOUNTER — Telehealth: Payer: Self-pay | Admitting: *Deleted

## 2014-04-06 ENCOUNTER — Other Ambulatory Visit: Payer: Self-pay | Admitting: Family Medicine

## 2014-04-06 NOTE — Telephone Encounter (Signed)
Pt requesting Rx refills Advised to contact pharmacy for refills

## 2014-04-06 NOTE — Telephone Encounter (Signed)
Advised pt to contact pharmacy for refills

## 2014-04-06 NOTE — Telephone Encounter (Signed)
Patient is calling to request a med refill for his insulin and the insulin pen, please f/u with pt.

## 2014-04-07 ENCOUNTER — Other Ambulatory Visit: Payer: Self-pay | Admitting: Family Medicine

## 2014-04-10 ENCOUNTER — Ambulatory Visit: Payer: Self-pay

## 2014-05-02 ENCOUNTER — Ambulatory Visit: Payer: Self-pay

## 2014-08-07 ENCOUNTER — Ambulatory Visit: Payer: Self-pay | Attending: Internal Medicine

## 2014-08-08 ENCOUNTER — Other Ambulatory Visit: Payer: Self-pay

## 2014-08-08 DIAGNOSIS — IMO0002 Reserved for concepts with insufficient information to code with codable children: Secondary | ICD-10-CM

## 2014-08-08 DIAGNOSIS — E1165 Type 2 diabetes mellitus with hyperglycemia: Secondary | ICD-10-CM

## 2014-08-08 MED ORDER — INSULIN ASPART 100 UNIT/ML ~~LOC~~ SOLN: 10.0000 [IU] | Freq: Three times a day (TID) | SUBCUTANEOUS | Status: DC

## 2014-08-08 MED ORDER — INSULIN GLARGINE 100 UNIT/ML SOLOSTAR PEN: 10.0000 [IU] | PEN_INJECTOR | Freq: Every day | SUBCUTANEOUS | Status: DC

## 2014-08-13 ENCOUNTER — Encounter: Payer: Self-pay | Admitting: Family Medicine

## 2014-08-13 ENCOUNTER — Ambulatory Visit: Payer: Self-pay | Attending: Family Medicine | Admitting: Family Medicine

## 2014-08-13 VITALS — BP 115/76 | HR 82 | Temp 98.5°F | Resp 16 | Ht 69.0 in | Wt 226.0 lb

## 2014-08-13 DIAGNOSIS — B353 Tinea pedis: Secondary | ICD-10-CM | POA: Insufficient documentation

## 2014-08-13 DIAGNOSIS — E1165 Type 2 diabetes mellitus with hyperglycemia: Secondary | ICD-10-CM

## 2014-08-13 DIAGNOSIS — E11649 Type 2 diabetes mellitus with hypoglycemia without coma: Secondary | ICD-10-CM | POA: Insufficient documentation

## 2014-08-13 DIAGNOSIS — E119 Type 2 diabetes mellitus without complications: Secondary | ICD-10-CM

## 2014-08-13 DIAGNOSIS — Z794 Long term (current) use of insulin: Secondary | ICD-10-CM | POA: Insufficient documentation

## 2014-08-13 DIAGNOSIS — IMO0002 Reserved for concepts with insufficient information to code with codable children: Secondary | ICD-10-CM

## 2014-08-13 LAB — POCT GLYCOSYLATED HEMOGLOBIN (HGB A1C): HEMOGLOBIN A1C: 6.6

## 2014-08-13 LAB — GLUCOSE, POCT (MANUAL RESULT ENTRY): POC Glucose: 125 mg/dl — AB (ref 70–99)

## 2014-08-13 MED ORDER — INSULIN GLARGINE 100 UNIT/ML SOLOSTAR PEN: 10.0000 [IU] | PEN_INJECTOR | Freq: Every day | SUBCUTANEOUS | Status: DC

## 2014-08-13 MED ORDER — SITAGLIPTIN PHOSPHATE 100 MG PO TABS: 100.0000 mg | ORAL_TABLET | Freq: Every day | ORAL | Status: DC

## 2014-08-13 MED ORDER — TERBINAFINE HCL 1 % EX CREA: 1.0000 "application " | TOPICAL_CREAM | Freq: Two times a day (BID) | CUTANEOUS | Status: DC

## 2014-08-13 NOTE — Assessment & Plan Note (Signed)
Foot itching and dry skin: Persistent foot fungus lamisil cream ordered

## 2014-08-13 NOTE — Assessment & Plan Note (Signed)
  1. Diabetes: Continue lantus 10 U daily  januvia 100 mg once daily   Diabetes blood sugar goals  Fasting (in AM before breakfast, 8 hrs of no eating or drinking (except water or unsweetened coffee or tea): 90-110 2 hrs after meals: < 160,   No low sugars: nothing < 70

## 2014-08-13 NOTE — Patient Instructions (Signed)
Matthew Montgomery,  Thank you for coming in today Excellent job with your blood sugar, diabetes is at goal  1. Diabetes: Continue lantus 10 U daily  januvia 100 mg once daily   Diabetes blood sugar goals  Fasting (in AM before breakfast, 8 hrs of no eating or drinking (except water or unsweetened coffee or tea): 90-110 2 hrs after meals: < 160,   No low sugars: nothing < 70    2. Foot itching and dry skin: Persistent foot fungus lamisil cream ordered  F/u with nurse in 3-4 weeks for blood sugar review  F/u in 3 months with me for diabetes

## 2014-08-13 NOTE — Progress Notes (Signed)
F/U DM Taking medication as prescribed  Hx tobacco 3 cigarette per day

## 2014-08-13 NOTE — Progress Notes (Signed)
   Subjective:    Patient ID: Matthew Montgomery, male    DOB: 01/11/1961, 54 y.o.   MRN: [id number] CC: DM2 f/u  HPI  1. CHRONIC DIABETES  Disease Monitoring  Blood Sugar Ranges: 90-130  Polyuria: no   Visual problems: no   Medication Compliance: yes  Medication Side Effects  Hypoglycemia: yes, 60 two week ago    Preventitive Health Care  Eye Exam: due   Foot Exam: done   Diet pattern: light meals   2. Tinea pedis: used lamisil which helped. Has dry skin and mild itching on b/l medial foot. No fever.   Soc Hx: light smoker, 3 cigs per day  Review of Systems  Constitutional: Negative for fever and chills.  Respiratory: Negative for shortness of breath.   Cardiovascular: Negative for chest pain.  Gastrointestinal: Negative for nausea and abdominal pain.       Objective:   Physical Exam BP 115/76 mmHg  Pulse 82  Temp(Src) 98.5 F (36.9 C) (Oral)  Resp 16  Ht 5\' 9"  (1.753 m)  Wt 226 lb (102.513 kg)  BMI 33.36 kg/m2  SpO2 95% General appearance: alert, cooperative and no distress Lungs: clear to auscultation bilaterally Heart: regular rate and rhythm, S1, S2 normal, no murmur, click, rub or gallop Extremities: extremities normal, atraumatic, no cyanosis or edema  Skin: dryness on medial aspect of plantar foot   Lab Results  Component Value Date   HGBA1C 6.60 08/13/2014   CBG 125      Assessment & Plan:

## 2014-08-14 MED ORDER — INSULIN GLARGINE 100 UNIT/ML SOLOSTAR PEN: 10.0000 [IU] | PEN_INJECTOR | Freq: Every day | SUBCUTANEOUS | Status: DC

## 2014-08-14 NOTE — Addendum Note (Signed)
Addended by: Dessa Phi on: 08/14/2014 10:31 AM   Modules accepted: Orders

## 2014-09-04 ENCOUNTER — Ambulatory Visit: Payer: Self-pay | Attending: Internal Medicine

## 2014-12-05 ENCOUNTER — Other Ambulatory Visit: Payer: Self-pay | Admitting: Family Medicine

## 2015-01-10 ENCOUNTER — Telehealth: Payer: Self-pay

## 2015-01-10 DIAGNOSIS — E118 Type 2 diabetes mellitus with unspecified complications: Secondary | ICD-10-CM

## 2015-01-10 MED ORDER — INSULIN GLARGINE 100 UNIT/ML SOLOSTAR PEN: 10.0000 [IU] | PEN_INJECTOR | Freq: Every day | SUBCUTANEOUS | Status: DC

## 2015-01-10 NOTE — Telephone Encounter (Signed)
Nurse called patient, patient verified date of birth. Patient aware of 1 month supply of Lantus being sent to pharmacy. Patient is at work and will call back tomorrow to make appointment to be seen.

## 2015-01-24 ENCOUNTER — Encounter: Payer: Self-pay | Admitting: Family Medicine

## 2015-01-24 ENCOUNTER — Ambulatory Visit: Payer: Self-pay | Attending: Family Medicine | Admitting: Family Medicine

## 2015-01-24 VITALS — BP 114/83 | HR 71 | Temp 99.6°F | Resp 16 | Ht 69.0 in | Wt 215.0 lb

## 2015-01-24 DIAGNOSIS — Z794 Long term (current) use of insulin: Secondary | ICD-10-CM | POA: Insufficient documentation

## 2015-01-24 DIAGNOSIS — Z79899 Other long term (current) drug therapy: Secondary | ICD-10-CM | POA: Insufficient documentation

## 2015-01-24 DIAGNOSIS — F17210 Nicotine dependence, cigarettes, uncomplicated: Secondary | ICD-10-CM

## 2015-01-24 DIAGNOSIS — E118 Type 2 diabetes mellitus with unspecified complications: Secondary | ICD-10-CM

## 2015-01-24 DIAGNOSIS — Z0000 Encounter for general adult medical examination without abnormal findings: Secondary | ICD-10-CM | POA: Insufficient documentation

## 2015-01-24 DIAGNOSIS — E119 Type 2 diabetes mellitus without complications: Secondary | ICD-10-CM | POA: Insufficient documentation

## 2015-01-24 DIAGNOSIS — K0889 Other specified disorders of teeth and supporting structures: Secondary | ICD-10-CM | POA: Insufficient documentation

## 2015-01-24 DIAGNOSIS — Z720 Tobacco use: Secondary | ICD-10-CM

## 2015-01-24 DIAGNOSIS — F17200 Nicotine dependence, unspecified, uncomplicated: Secondary | ICD-10-CM | POA: Insufficient documentation

## 2015-01-24 LAB — POCT GLYCOSYLATED HEMOGLOBIN (HGB A1C): Hemoglobin A1C: 6.1

## 2015-01-24 LAB — GLUCOSE, POCT (MANUAL RESULT ENTRY): POC Glucose: 100 mg/dl — AB (ref 70–99)

## 2015-01-24 MED ORDER — INSULIN GLARGINE 100 UNIT/ML SOLOSTAR PEN: 10.0000 [IU] | PEN_INJECTOR | Freq: Every day | SUBCUTANEOUS | Status: DC

## 2015-01-24 MED ORDER — SITAGLIPTIN PHOSPHATE 100 MG PO TABS: 100.0000 mg | ORAL_TABLET | Freq: Every day | ORAL | Status: DC

## 2015-01-24 NOTE — Progress Notes (Signed)
F/U DM Taking medication as prescribed  Tobacco user 5-6 per day  No pain today  No suicide thought in the past two weeks

## 2015-01-24 NOTE — Progress Notes (Signed)
Subjective:  Patient ID: Matthew Montgomery, male    DOB: 07/20/1960  Age: 54 y.o. MRN: [id number]  CC: Diabetes   HPI DUVID SMALLS presents for    1. CHRONIC DIABETES  Disease Monitoring  Blood Sugar Ranges:   Polyuria: no   Visual problems: no   Medication Compliance: yes  Medication Side Effects  Hypoglycemia: no   Preventitive Health Care  Eye Exam: due,has not had eye exam   Foot Exam: done today   Diet pattern: eating well   Exercise: yes, walking   2. Smoking: quit for 7 years when in prison. When he got out he started smoking. He smokes after meals. He plans to quit in January 2017.  3. Dental pain: both in both 2nd molars. No swelling. No fever. Requesting dental referral.   Social History  Substance Use Topics  . Smoking status: Current Every Day Smoker  . Smokeless tobacco: Not on file  . Alcohol Use: Yes   Outpatient Prescriptions Prior to Visit  Medication Sig Dispense Refill  . atorvastatin (LIPITOR) 40 MG tablet Take 1 tablet (40 mg total) by mouth daily. (Patient not taking: Reported on 08/13/2014) 30 tablet 3  . Blood Glucose Monitoring Suppl (TRUERESULT BLOOD GLUCOSE) W/DEVICE KIT 1 each by Does not apply route 3 (three) times daily before meals. (Patient not taking: Reported on 08/13/2014) 1 each 0  . glucose blood (TRUETEST TEST) test strip 1 each by Other route 3 (three) times daily. Use as instructed 100 each 12  . Insulin Glargine (LANTUS) 100 UNIT/ML Solostar Pen Inject 10 Units into the skin daily before breakfast. 15 mL 0  . Insulin Pen Needle (B-D ULTRAFINE III SHORT PEN) 31G X 8 MM MISC 1 application by Does not apply route daily. 100 each 3  . Insulin Syringes, Disposable, U-100 0.5 ML MISC 1 each by Does not apply route 2 (two) times daily before lunch and supper. 100 each 5  . omeprazole (PRILOSEC) 40 MG capsule Take 1 capsule (40 mg total) by mouth 2 (two) times daily before a meal. 60 capsule 1  . sitaGLIPtin (JANUVIA) 100 MG tablet Take 1  tablet (100 mg total) by mouth daily. 30 tablet 5  . terbinafine (LAMISIL AT) 1 % cream Apply 1 application topically 2 (two) times daily. 30 g 0  . TRUEPLUS LANCETS 28G MISC 1 each by Does not apply route 3 (three) times daily. 100 each 12   Facility-Administered Medications Prior to Visit  Medication Dose Route Frequency Provider Last Rate Last Dose  . 0.9 %  sodium chloride infusion   Intravenous Continuous Charmika Macdonnell, MD 999 mL/hr at 03/08/14 1118      ROS Review of Systems  Constitutional: Negative for fever, chills, fatigue and unexpected weight change.  HENT: Positive for dental problem.   Eyes: Negative for visual disturbance.  Respiratory: Negative for cough and shortness of breath.   Cardiovascular: Negative for chest pain, palpitations and leg swelling.  Gastrointestinal: Negative for nausea, vomiting, abdominal pain, diarrhea, constipation and blood in stool.  Endocrine: Negative for polydipsia, polyphagia and polyuria.  Musculoskeletal: Negative for myalgias, back pain, arthralgias, gait problem and neck pain.  Skin: Negative for rash.  Allergic/Immunologic: Negative for immunocompromised state.  Hematological: Negative for adenopathy. Does not bruise/bleed easily.  Psychiatric/Behavioral: Negative for suicidal ideas, sleep disturbance and dysphoric mood. The patient is not nervous/anxious.     Objective:  BP 114/83 mmHg  Pulse 71  Temp(Src) 99.6 F (37.6 C) (Oral)  Resp 16  Ht _0  (1.753 m)  Wt 215 lb (97.523 kg)  BMI 31.74 kg/m2  SpO2 98%  BP/Weight 01/24/2015 6/81/2751 7/0/0174  Systolic BP 944 967 -  Diastolic BP 83 76 -  Wt. (Lbs) 215 226 211  BMI 31.74 33.36 31.15    Physical Exam  Constitutional: He appears well-developed and well-nourished. No distress.  HENT:  Head: Normocephalic and atraumatic.  Neck: Normal range of motion. Neck supple.  Cardiovascular: Normal rate, regular rhythm, normal heart sounds and intact distal pulses.     Pulmonary/Chest: Effort normal and breath sounds normal.  Musculoskeletal: He exhibits no edema.  Neurological: He is alert.  Skin: Skin is warm and dry. No rash noted. No erythema.  Psychiatric: He has a normal mood and affect.   Lab Results  Component Value Date   HGBA1C 6.60 08/13/2014   Lab Results  Component Value Date   HGBA1C 6.10 01/24/2015   CBG 100  Assessment & Plan:   Problem List Items Addressed This Visit    Diabetes mellitus type 2, controlled ([place]) - Primary (Chronic)   Relevant Medications   Insulin Glargine (LANTUS) 100 UNIT/ML Solostar Pen   sitaGLIPtin (JANUVIA) 100 MG tablet   Other Relevant Orders   HgB A1c (Completed)   Glucose (CBG) (Completed)   Ambulatory referral to Ophthalmology    Other Visit Diagnoses    Pain of molar        Relevant Orders    Ambulatory referral to Dentistry       No orders of the defined types were placed in this encounter.    Follow-up: No Follow-up on file.   Boykin Nearing MD

## 2015-01-24 NOTE — Patient Instructions (Addendum)
Jari FavreOscar was seen today for diabetes.  Diagnoses and all orders for this visit:  Controlled type 2 diabetes mellitus with complication, without long-term current use of insulin (HCC) -     HgB A1c -     Glucose (CBG) -     Ambulatory referral to Ophthalmology -     Insulin Glargine (LANTUS) 100 UNIT/ML Solostar Pen; Inject 10 Units into the skin daily before breakfast. -     sitaGLIPtin (JANUVIA) 100 MG tablet; Take 1 tablet (100 mg total) by mouth daily.  Pain of molar -     Ambulatory referral to Dentistry   Smoking cessation support: smoking cessation hotline: 1-800-QUIT-NOW.  Smoking cessation classes are available through [facility] and [facility]. Call [phone] or visit our website at [email or web address].  F/u in 6 months for diabetes   Dr. Armen PickupFunches

## 2015-01-24 NOTE — Assessment & Plan Note (Signed)
Cessation discussed 

## 2015-01-31 ENCOUNTER — Ambulatory Visit: Payer: Self-pay

## 2015-02-06 ENCOUNTER — Ambulatory Visit: Payer: Self-pay

## 2015-03-25 ENCOUNTER — Other Ambulatory Visit: Payer: Self-pay | Admitting: Family Medicine

## 2015-06-07 ENCOUNTER — Other Ambulatory Visit: Payer: Self-pay | Admitting: Family Medicine

## 2015-06-07 DIAGNOSIS — E118 Type 2 diabetes mellitus with unspecified complications: Secondary | ICD-10-CM

## 2015-06-07 MED ORDER — INSULIN GLARGINE 100 UNIT/ML SOLOSTAR PEN: 10.0000 [IU] | PEN_INJECTOR | Freq: Every day | SUBCUTANEOUS | Status: DC

## 2015-06-07 NOTE — Telephone Encounter (Signed)
Received a call from after hours nurse line Patient out of lantus [facility] [address] Agreed with plan.  Sent in refill

## 2015-06-13 ENCOUNTER — Ambulatory Visit (HOSPITAL_COMMUNITY)
Admission: EM | Admit: 2015-06-13 | Discharge: 2015-06-13 | Disposition: A | Discharge status: Home / Self Care | Payer: Self-pay | Attending: Family Medicine | Admitting: Family Medicine

## 2015-06-13 ENCOUNTER — Encounter (HOSPITAL_COMMUNITY): Payer: Self-pay | Admitting: Emergency Medicine

## 2015-06-13 DIAGNOSIS — S39012A Strain of muscle, fascia and tendon of lower back, initial encounter: Secondary | ICD-10-CM

## 2015-06-13 MED ORDER — CYCLOBENZAPRINE HCL 5 MG PO TABS: 5.0000 mg | ORAL_TABLET | Freq: Three times a day (TID) | ORAL | Status: DC | PRN

## 2015-06-13 MED ORDER — DICLOFENAC POTASSIUM 50 MG PO TABS: 50.0000 mg | ORAL_TABLET | Freq: Three times a day (TID) | ORAL | Status: DC

## 2015-06-13 MED ORDER — KETOROLAC TROMETHAMINE 30 MG/ML IJ SOLN
30.0000 mg | Freq: Once | INTRAMUSCULAR | Status: AC
  Administered 2015-06-13: 30 mg via INTRAMUSCULAR

## 2015-06-13 MED ORDER — KETOROLAC TROMETHAMINE 30 MG/ML IJ SOLN
INTRAMUSCULAR | Status: AC
  Filled 2015-06-13: qty 1

## 2015-06-13 NOTE — ED Provider Notes (Signed)
CSN: [id number]     Arrival date & time 06/13/15  1536 History   First MD Initiated Contact with Patient 06/13/15 1716     Chief Complaint  Patient presents with  . Back Pain   (Consider location/radiation/quality/duration/timing/severity/associated sxs/prior Treatment) Patient is a 55 y.o. male presenting with back pain. The history is provided by the patient.  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 recent flare at work today, no gi or gu sx no weakness or leg numbness.   Past Medical History  Diagnosis Date  . Diabetes mellitus without complication ([place]) 07/7122    History reviewed. No pertinent past surgical history. Family History  Problem Relation Age of Onset  . Hypertension Mother   . Diabetes Mother   . Diabetes Father   . Cancer Father    Social History  Substance Use Topics  . Smoking status: Current Every Day Smoker  . Smokeless tobacco: None  . Alcohol Use: Yes    Review of Systems  Constitutional: Negative.   Genitourinary: Negative.   Musculoskeletal: Positive for back pain. Negative for joint swelling and gait problem.  Skin: Negative.   All other systems reviewed and are negative.   Allergies  Review of patient's allergies indicates no known allergies.  Home Medications   Prior to Admission medications   Medication Sig Start Date End Date Taking? Authorizing Provider  Blood Glucose Monitoring Suppl (TRUERESULT BLOOD GLUCOSE) W/DEVICE KIT 1 each by Does not apply route 3 (three) times daily before meals. 03/08/14   Josalyn Funches, MD  cyclobenzaprine (FLEXERIL) 5 MG tablet Take 1 tablet (5 mg total) by mouth 3 (three) times daily as needed for muscle spasms. 06/13/15   Billy Fischer, MD  cyclobenzaprine  (FLEXERIL) 5 MG tablet Take 1 tablet (5 mg total) by mouth 3 (three) times daily as needed for muscle spasms. 06/13/15   Billy Fischer, MD  diclofenac (CATAFLAM) 50 MG tablet Take 1 tablet (50 mg total) by mouth 3 (three) times daily. As needed for pain 06/13/15   Billy Fischer, MD  glucose blood (TRUETEST TEST) test strip 1 each by Other route 3 (three) times daily. Use as instructed 03/08/14   Boykin Nearing, MD  Insulin Glargine (LANTUS) 100 UNIT/ML Solostar Pen Inject 10 Units into the skin daily before breakfast. 06/07/15   Boykin Nearing, MD  Insulin Syringes, Disposable, U-100 0.5 ML MISC 1 each by Does not apply route 2 (two) times daily before lunch and supper. 03/09/14   Josalyn Funches, MD  sitaGLIPtin (JANUVIA) 100 MG tablet Take 1 tablet (100 mg total) by mouth daily. 01/24/15   Boykin Nearing, MD  TRUEPLUS LANCETS 28G MISC 1 each by Does not apply route 3 (three) times daily. 03/08/14   Boykin Nearing, MD  ULTICARE MICRO PEN NEEDLES 32G X 4 MM MISC USE AS DIRECTED. 03/25/15   Boykin Nearing, MD   Meds Ordered and Administered this Visit   Medications  ketorolac (TORADOL) 30 MG/ML injection 30 mg (30 mg Intramuscular Given 06/13/15 1756)    BP 139/83 mmHg  Pulse 75  Temp(Src) 97.9 F (36.6 C) (Oral)  Resp 16  SpO2 97% No data found.   Physical Exam  Constitutional: He is oriented to person, place, and time. He appears  well-developed and well-nourished. No distress.  Abdominal: Soft. Bowel sounds are normal.  Musculoskeletal: He exhibits tenderness.       Lumbar back: He exhibits decreased range of motion, tenderness, pain and spasm. He exhibits no deformity and normal pulse.       Back:  Neurological: He is alert and oriented to person, place, and time.  Skin: Skin is warm and dry.  Nursing note and vitals reviewed.   ED Course  Procedures (including critical care time)  Labs Review Labs Reviewed - No data to display  Imaging Review No results found.   Visual  Acuity Review  Right Eye Distance:   Left Eye Distance:   Bilateral Distance:    Right Eye Near:   Left Eye Near:    Bilateral Near:         MDM   1. Low back strain, initial encounter        Billy Fischer, MD 06/15/15 2040

## 2015-06-13 NOTE — ED Notes (Signed)
Patient complains of chronic back pain.  Patient reports hurting his back in 2000.  Over the years pain has been intermittent.  Bending and lifting sem to aggravate his back.  Today when pain hit, patient went down on his knees.  .  Patient here for his back pain

## 2015-06-24 ENCOUNTER — Emergency Department (HOSPITAL_COMMUNITY)
Admission: EM | Admit: 2015-06-24 | Discharge: 2015-06-24 | Disposition: A | Discharge status: Home / Self Care | Payer: Self-pay | Attending: Emergency Medicine | Admitting: Emergency Medicine

## 2015-06-24 DIAGNOSIS — Z794 Long term (current) use of insulin: Secondary | ICD-10-CM | POA: Insufficient documentation

## 2015-06-24 DIAGNOSIS — R61 Generalized hyperhidrosis: Secondary | ICD-10-CM | POA: Insufficient documentation

## 2015-06-24 DIAGNOSIS — F17210 Nicotine dependence, cigarettes, uncomplicated: Secondary | ICD-10-CM | POA: Insufficient documentation

## 2015-06-24 DIAGNOSIS — B349 Viral infection, unspecified: Secondary | ICD-10-CM | POA: Insufficient documentation

## 2015-06-24 DIAGNOSIS — E119 Type 2 diabetes mellitus without complications: Secondary | ICD-10-CM | POA: Insufficient documentation

## 2015-06-24 DIAGNOSIS — Z79899 Other long term (current) drug therapy: Secondary | ICD-10-CM | POA: Insufficient documentation

## 2015-06-24 LAB — LIPASE, BLOOD: LIPASE: 25 U/L (ref 11–51)

## 2015-06-24 LAB — COMPREHENSIVE METABOLIC PANEL WITH GFR
ALT: 27 U/L (ref 17–63)
AST: 29 U/L (ref 15–41)
Albumin: 4 g/dL (ref 3.5–5.0)
Alkaline Phosphatase: 58 U/L (ref 38–126)
Anion gap: 9 (ref 5–15)
BUN: 9 mg/dL (ref 6–20)
CO2: 25 mmol/L (ref 22–32)
Calcium: 9.4 mg/dL (ref 8.9–10.3)
Chloride: 103 mmol/L (ref 101–111)
Creatinine, Ser: 1.27 mg/dL — ABNORMAL HIGH (ref 0.61–1.24)
GFR calc Af Amer: >60 mL/min
GFR calc non Af Amer: >60 mL/min
Glucose, Bld: 170 mg/dL — ABNORMAL HIGH (ref 65–99)
Potassium: 3.8 mmol/L (ref 3.5–5.1)
Sodium: 137 mmol/L (ref 135–145)
Total Bilirubin: 1.2 mg/dL (ref 0.3–1.2)
Total Protein: 7.8 g/dL (ref 6.5–8.1)

## 2015-06-24 LAB — CBC WITH DIFFERENTIAL/PLATELET
Basophils Absolute: 0 K/uL (ref 0.0–0.1)
Basophils Relative: 0 %
Eosinophils Absolute: 0 K/uL (ref 0.0–0.7)
Eosinophils Relative: 0 %
HCT: 46.1 % (ref 39.0–52.0)
Hemoglobin: 16.1 g/dL (ref 13.0–17.0)
Lymphocytes Relative: 13 %
Lymphs Abs: 1.5 K/uL (ref 0.7–4.0)
MCH: 31.4 pg (ref 26.0–34.0)
MCHC: 34.9 g/dL (ref 30.0–36.0)
MCV: 90 fL (ref 78.0–100.0)
Monocytes Absolute: 1 K/uL (ref 0.1–1.0)
Monocytes Relative: 9 %
Neutro Abs: 8.9 K/uL — ABNORMAL HIGH (ref 1.7–7.7)
Neutrophils Relative %: 78 %
Platelets: 218 K/uL (ref 150–400)
RBC: 5.12 MIL/uL (ref 4.22–5.81)
RDW: 11.8 % (ref 11.5–15.5)
WBC: 11.4 K/uL — ABNORMAL HIGH (ref 4.0–10.5)

## 2015-06-24 LAB — URINALYSIS, ROUTINE W REFLEX MICROSCOPIC
Bilirubin Urine: NEGATIVE
Glucose, UA: NEGATIVE mg/dL
Hgb urine dipstick: NEGATIVE
Ketones, ur: 15 mg/dL — AB
Leukocytes, UA: NEGATIVE
Nitrite: NEGATIVE
Protein, ur: NEGATIVE mg/dL
Specific Gravity, Urine: 1.028 (ref 1.005–1.030)
pH: 5.5 (ref 5.0–8.0)

## 2015-06-24 LAB — CBG MONITORING, ED: Glucose-Capillary: 185 mg/dL — ABNORMAL HIGH (ref 65–99)

## 2015-06-24 MED ORDER — ACETAMINOPHEN 325 MG PO TABS
650.0000 mg | ORAL_TABLET | Freq: Once | ORAL | Status: AC
  Administered 2015-06-24: 650 mg via ORAL
  Filled 2015-06-24: qty 2

## 2015-06-24 MED ORDER — ONDANSETRON HCL 4 MG PO TABS: 4.0000 mg | ORAL_TABLET | Freq: Four times a day (QID) | ORAL | Status: DC

## 2015-06-24 MED ORDER — ONDANSETRON HCL 4 MG/2ML IJ SOLN
4.0000 mg | Freq: Once | INTRAMUSCULAR | Status: AC
  Administered 2015-06-24: 4 mg via INTRAVENOUS
  Filled 2015-06-24: qty 2

## 2015-06-24 MED ORDER — SODIUM CHLORIDE 0.9 % IV BOLUS (SEPSIS)
1000.0000 mL | Freq: Once | INTRAVENOUS | Status: AC
  Administered 2015-06-24: 1000 mL via INTRAVENOUS

## 2015-06-24 NOTE — ED Provider Notes (Signed)
CSN: [id number]     Arrival date & time 06/24/15  1029 History   By signing my name below, I, Altamease Oiler, attest that this documentation has been prepared under the direction and in the presence of Wally Behan PA-C. Electronically Signed: Altamease Oiler, ED Scribe. 06/24/2015 11:44 AM    Chief Complaint  Patient presents with  . Abdominal Pain   The history is provided by the patient. No language interpreter was used.   Matthew Montgomery is a 55 y.o. male with PMHx of DM who presents to the Emergency Department complaining of flu-like symptoms onset yesterday. He reports severe generalized myalgias, chills and diaphoresis. He states that he feels "like I'm under cold running water". Associated symptoms include  nasal congestion, headache, abdominal pain, nausea, and 1 episode of emesis while in ED. He denies purulent nasal discharge or sinus pressure. He states that his headache "feels like the rest of my body, it just hurts so bad". Denies dizziness, lightheadedness, vision changes, neck pain, neck stiffness. He also states that his abdominal pain feels similar to his body aches. The pain is generalized over his abdomen. Denies diarrhea. He has not taken any OTC meds for his symptoms. Pt denies fevers at home, eye discharge, ear pain, sore throat, cough, chest pain, SOB, diarrhea, rashes or other complaints today. No known sick contacts. His blood sugar has been running around 150 at home.   Past Medical History  Diagnosis Date  . Diabetes mellitus without complication ([place]) 05/345    No past surgical history on file. Family History  Problem Relation Age of Onset  . Hypertension Mother   . Diabetes Mother   . Diabetes Father   . Cancer Father    Social History  Substance Use Topics  . Smoking status: Current Every Day Smoker  . Smokeless tobacco: Not on file  . Alcohol Use: Yes    Review of Systems  Constitutional: Positive for chills and diaphoresis. Negative for fever.   HENT: Positive for congestion. Negative for ear pain, rhinorrhea and sore throat.   Respiratory: Negative for cough and shortness of breath.   Cardiovascular: Negative for chest pain.  Gastrointestinal: Positive for abdominal pain.  Musculoskeletal: Positive for myalgias.  Neurological: Positive for headaches.  All other systems reviewed and are negative.     Allergies  Review of patient's allergies indicates no known allergies.  Home Medications   Prior to Admission medications   Medication Sig Start Date End Date Taking? Authorizing Provider  Blood Glucose Monitoring Suppl (TRUERESULT BLOOD GLUCOSE) W/DEVICE KIT 1 each by Does not apply route 3 (three) times daily before meals. 03/08/14   Josalyn Funches, MD  cyclobenzaprine (FLEXERIL) 5 MG tablet Take 1 tablet (5 mg total) by mouth 3 (three) times daily as needed for muscle spasms. 06/13/15   Billy Fischer, MD  cyclobenzaprine (FLEXERIL) 5 MG tablet Take 1 tablet (5 mg total) by mouth 3 (three) times daily as needed for muscle spasms. 06/13/15   Billy Fischer, MD  diclofenac (CATAFLAM) 50 MG tablet Take 1 tablet (50 mg total) by mouth 3 (three) times daily. As needed for pain 06/13/15   Billy Fischer, MD  glucose blood (TRUETEST TEST) test strip 1 each by Other route 3 (three) times daily. Use as instructed 03/08/14   Boykin Nearing, MD  Insulin Glargine (LANTUS) 100 UNIT/ML Solostar Pen Inject 10 Units into the skin daily before breakfast. 06/07/15   Boykin Nearing, MD  Insulin Syringes, Disposable, U-100 0.5  ML MISC 1 each by Does not apply route 2 (two) times daily before lunch and supper. 03/09/14   Josalyn Funches, MD  ondansetron (ZOFRAN) 4 MG tablet Take 1 tablet (4 mg total) by mouth every 6 (six) hours. 06/24/15   Tykeem Lanzer, PA-C  sitaGLIPtin (JANUVIA) 100 MG tablet Take 1 tablet (100 mg total) by mouth daily. 01/24/15   Boykin Nearing, MD  TRUEPLUS LANCETS 28G MISC 1 each by Does not apply route 3 (three) times daily. 03/08/14    Josalyn Funches, MD  ULTICARE MICRO PEN NEEDLES 32G X 4 MM MISC USE AS DIRECTED. 03/25/15   Josalyn Funches, MD   BP 122/81 mmHg  Pulse 87  Temp(Src) 100 F (37.8 C) (Oral)  Resp 20  SpO2 100% Physical Exam  Constitutional: He appears well-developed and well-nourished. No distress.  Nontoxic appearing  HENT:  Head: Normocephalic and atraumatic.  Right Ear: Tympanic membrane, external ear and ear canal normal.  Left Ear: Tympanic membrane, external ear and ear canal normal.  Nose: Nose normal. No mucosal edema or rhinorrhea. Right sinus exhibits no maxillary sinus tenderness and no frontal sinus tenderness. Left sinus exhibits no maxillary sinus tenderness and no frontal sinus tenderness.  Mouth/Throat: Uvula is midline and oropharynx is clear and moist. Mucous membranes are not dry. No oropharyngeal exudate.  Eyes: Conjunctivae and EOM are normal. Pupils are equal, round, and reactive to light. Right eye exhibits no discharge. Left eye exhibits no discharge. No scleral icterus.  Neck: Normal range of motion. Neck supple.  No meningeals signs  Cardiovascular: Normal rate, regular rhythm and normal heart sounds.   Pulmonary/Chest: Effort normal and breath sounds normal. No respiratory distress. He has no wheezes. He has no rales.  Abdominal: Soft. Bowel sounds are normal. He exhibits no distension. There is tenderness (generalized). There is no rebound and no guarding.  Musculoskeletal: Normal range of motion.  Moves all extremities spontaneously  Lymphadenopathy:    He has no cervical adenopathy.  Neurological: He is alert. No cranial nerve deficit. Coordination normal.  Skin: Skin is warm and dry. He is not diaphoretic.  Psychiatric: He has a normal mood and affect. His behavior is normal.  Nursing note and vitals reviewed.   ED Course  Procedures (including critical care time) DIAGNOSTIC STUDIES: Oxygen Saturation is 100% on RA,  normal by my interpretation.    COORDINATION OF  CARE: 11:40 AM Discussed treatment plan which includes lab work, Tylenol, and IVF with pt at bedside and pt agreed to plan.  Labs Review Labs Reviewed  CBC WITH DIFFERENTIAL/PLATELET - Abnormal; Notable for the following:    WBC 11.4 (*)    Neutro Abs 8.9 (*)    All other components within normal limits  COMPREHENSIVE METABOLIC PANEL - Abnormal; Notable for the following:    Glucose, Bld 170 (*)    Creatinine, Ser 1.27 (*)    All other components within normal limits  URINALYSIS, ROUTINE W REFLEX MICROSCOPIC (NOT AT [facility]) - Abnormal; Notable for the following:    Ketones, ur 15 (*)    All other components within normal limits  CBG MONITORING, ED - Abnormal; Notable for the following:    Glucose-Capillary 185 (*)    All other components within normal limits  LIPASE, BLOOD    Imaging Review No results found. I have personally reviewed and evaluated these lab results as part of my medical decision-making.   EKG Interpretation None      MDM   Final diagnoses:  Viral  illness   55 year old male presenting with fever, chills, congestion, myalgias, headache and abdominal pain x one day. Temperature 100 in triage. Hemodynamically stable and nontoxic appearing. TMs pearly gray with good visualization of landmarks bilaterally. No nasal congestion noted. Oropharynx without erythema or exudate. No cervical adenopathy. Lungs clear to auscultation bilaterally. Mild generalized abdominal tenderness to palpation. No peritoneal signs suggesting surgical abdomen. Slight leukocytosis at 11.4. Creatinine elevated to 1.27. Chart review shows that it is intermittently elevated but usually below 1. Glucose 170. Urinalysis without infection. Given Tylenol and one liter bolus and emergency department. On reevaluation, patient reports significant improvement in symptoms. Patient presentation consistent with a viral illness. Discussed patient's elevated creatinine and that he will need to get follow-up  blood work with his PCP in 2-3 days. I also discussed reasons to return immediately to the emergency department. Patient is stable for discharge.  I personally performed the services described in this documentation, which was scribed in my presence. The recorded information has been reviewed and is accurate.   Lahoma Crocker Madden Garron, PA-C 06/24/15 1411  Davonna Belling, MD 06/24/15 1558

## 2015-06-24 NOTE — ED Notes (Signed)
Patient given urinal and advised of need for urine specimen.  Will continue to monitor.

## 2015-06-24 NOTE — Discharge Instructions (Signed)
Viral Infections A viral infection can be caused by different types of viruses.Most viral infections are not serious and resolve on their own. However, some infections may cause severe symptoms and may lead to further complications. SYMPTOMS Viruses can frequently cause:  Minor sore throat.  Aches and pains.  Headaches.  Runny nose.  Different types of rashes.  Watery eyes.  Tiredness.  Cough.  Loss of appetite.  Gastrointestinal infections, resulting in nausea, vomiting, and diarrhea. These symptoms do not respond to antibiotics because the infection is not caused by bacteria. However, you might catch a bacterial infection following the viral infection. This is sometimes called a "superinfection." Symptoms of such a bacterial infection may include: 1. Worsening sore throat with pus and difficulty swallowing. 2. Swollen neck glands. 3. Chills and a high or persistent fever. 4. Severe headache. 5. Tenderness over the sinuses. 6. Persistent overall ill feeling (malaise), muscle aches, and tiredness (fatigue). 7. Persistent cough. 8. Yellow, green, or brown mucus production with coughing. HOME CARE INSTRUCTIONS   Only take over-the-counter or prescription medicines for pain, discomfort, diarrhea, or fever as directed by your caregiver.  Drink enough water and fluids to keep your urine clear or pale yellow. Sports drinks can provide valuable electrolytes, sugars, and hydration.  Get plenty of rest and maintain proper nutrition. Soups and broths with crackers or rice are fine. SEEK IMMEDIATE MEDICAL CARE IF:   You have severe headaches, shortness of breath, chest pain, neck pain, or an unusual rash.  You have uncontrolled vomiting, diarrhea, or you are unable to keep down fluids.  You or your child has an oral temperature above 102 F (38.9 C), not controlled by medicine.  Your baby is older than 3 months with a rectal temperature of 102 F (38.9 C) or higher.  Your  baby is 38 months old or younger with a rectal temperature of 100.4 F (38 C) or higher. MAKE SURE YOU:   Understand these instructions.  Will watch your condition.  Will get help right away if you are not doing well or get worse.   This information is not intended to replace advice given to you by your health care provider. Make sure you discuss any questions you have with your health care provider.   Document Released: 11/19/2004 Document Revised: 05/04/2011 Document Reviewed: 07/18/2014 Elsevier Interactive Patient Education 2016 [employer].  Infection Control in the Home If you have an infection or you are taking care of someone who has an infection, it is important to know how to keep the infection from spreading. Follow these guidelines to help stop the spread of infection, and talk to your health care provider. HOW ARE INFECTIONS SPREAD? In order for an infection to spread, the following must be present:  A germ. This may be a virus, bacteria, fungus, or parasite.  A place for the germ to live. This may be:  On or in a person, animal, plant, or food.  In soil or water.  On surfaces, such as a door handle.  A susceptible host. This is a person or animal who does not have resistance (immunity) to the germ.  A way for the germ to enter the host. This may occur by:  Direct contact. This may happen by making contact--such as shaking hands or hugging--with an infected person or animal. Some germs can also travel through the air and spread to you if an infected person coughs or sneezes on you or near you.  Indirect contact. This is when  the germ enters the host through contact with an infected object. Examples include eating contaminated food, drinking contaminated water, or touching a contaminated surface with your hands and then touching your face, nose, or mouth soon after that. HOW CAN I HELP TO PREVENT INFECTION FROM SPREADING? There are several things that you can do to  help prevent infection from spreading. Hand Washing It is very important to wash your hands correctly, following these steps: 9. Wet your hands with clean, running water. 10. Apply soap to your hands. Liquid soap is better than bar soap. 11. Rub your hands together quickly to create lather. 12. Keep rubbing your hands together for at least 20 seconds. Thoroughly scrub all parts of your hands, including under your fingernails and between your fingers. 13. Rinse your hands with clean, running water until all of the soap is gone. 14. Dry your hands with an air dryer or a clean paper or cloth towel, or let your hands air-dry. Do not use your clothing or a soiled towel to dry your hands. 15. If you are in a public restroom, use your towel to turn off the water faucet and to open the bathroom door. Make sure to wash your hands:  Before:  Visiting a baby or anyone with a weakened or lowered defense (immune) system.  Putting in and taking out any contact lenses.  After:  Working or playing outside.  Touching an animal or its toys or leash.  Handling livestock.  Using the bathroom or helping a child or adult to use the bathroom.  Using household cleaners or toxic chemicals.  Touching or taking out the garbage.  Touching anything dirty around your home.  Handling soiled clothes or rags.  Taking care of a sick child. This includes touching used tissues, toys, and clothes.  Sneezing, coughing, or blowing your nose.  Using public transportation.  Shaking hands.  Using a phone, including your mobile phone.  Touching money.  Before and after:  Preparing food.  Preparing a bottle for a baby.  Feeding a baby or a young child.  Eating.  Visiting or taking care of someone who is sick.  Changing a diaper.  Changing a bandage (dressing) or taking care of an injury or wound.  Giving or taking medicine. Taking Care of Your Home  Make sure that you have enough cleaning  supplies at all times. These include:  Disinfectants.  Reusable cleaning cloths. Wash these after each use.  Paper towels.  Utility gloves. Replace your gloves if they are cracked or torn or if they start to peel.  Use bleach safely. Never mix it with other cleaning products, especially those that contain ammonia. This mixture can create a dangerous gas that may be deadly.  Take care of your cleaning supplies. Toilet brushes, mops, and sponges can breed germs. Soak them in bleach and water for 5 minutes after each use.  Do not pour used mop water down the sink. Pour it down the toilet instead.  Maintain proper ventilation in your home.  If you have a pet, ensure that your pet stays clean. Do not let people with weak immune systems touch bird droppings, fish tank water, or a litter box.  If you have a cat, be sure to change the litter every day.  In the bathroom, make sure you:  Provide liquid soap.  Change towels and washcloths frequently. Avoid sharing towels and washcloths.  Change toothbrushes often and store them separately in a clean, dry place.  Disinfect  the toilet.  Clean the tub, shower, and sink with standard cleaning products.   Mop the floor with a standard cleaner.  Do not share personal items, such as razors, toothbrushes, drinking glasses, deodorant, combs, brushes, towels, and washcloths.   In the kitchen, make sure you:  Store food carefully.  Refrigerate leftovers promptly in covered containers.  Throw out stale or spoiled food.  Clean the inside of your refrigerator each week.  Keep your refrigerator set at 36F (4C) or less, and set your freezer at 56F (-18C) or less.  Thaw foods in the refrigerator or microwave, not at room temperature.  Serve foods at the proper temperature. Do not eat raw meat. Make sure it is cooked to the appropriate temperature.Cook eggs until they are firm.  Wash fruits and vegetables under running water.  Use  separate cutting boards, plates, and utensils for raw foods and cooked foods.  Keep work surfaces clean.  Use a clean spoon each time you sample food while cooking.  Wash your dishes in hot, soapy water. Air-dry your dishes or use a dishwasher.  Do not share forks, cups, or spoons during meals.  Wear gloves if laundry is visibly soiled.  Change linens each week or whenever they are soiled.  Do not shake soiled linens. Doing that may send germs into the air. Put dressings, sanitary or incontinence pads, diapers, and gloves in plastic garbage bags for disposal.   This information is not intended to replace advice given to you by your health care provider. Make sure you discuss any questions you have with your health care provider.   Document Released: 11/19/2007 Document Revised: 03/02/2014 Document Reviewed: 10/12/2013 Elsevier Interactive Patient Education [employer]2016 [employer] Inc.

## 2015-06-25 ENCOUNTER — Encounter (HOSPITAL_COMMUNITY): Payer: Self-pay | Admitting: Emergency Medicine

## 2015-06-25 ENCOUNTER — Emergency Department (HOSPITAL_COMMUNITY)
Admission: EM | Admit: 2015-06-25 | Discharge: 2015-06-25 | Disposition: A | Discharge status: Home / Self Care | Payer: Self-pay | Attending: Emergency Medicine | Admitting: Emergency Medicine

## 2015-06-25 DIAGNOSIS — F17200 Nicotine dependence, unspecified, uncomplicated: Secondary | ICD-10-CM | POA: Insufficient documentation

## 2015-06-25 DIAGNOSIS — E119 Type 2 diabetes mellitus without complications: Secondary | ICD-10-CM | POA: Insufficient documentation

## 2015-06-25 DIAGNOSIS — R1084 Generalized abdominal pain: Secondary | ICD-10-CM | POA: Insufficient documentation

## 2015-06-25 DIAGNOSIS — F17210 Nicotine dependence, cigarettes, uncomplicated: Secondary | ICD-10-CM | POA: Insufficient documentation

## 2015-06-25 DIAGNOSIS — M545 Low back pain: Secondary | ICD-10-CM | POA: Insufficient documentation

## 2015-06-25 DIAGNOSIS — B349 Viral infection, unspecified: Secondary | ICD-10-CM | POA: Insufficient documentation

## 2015-06-25 DIAGNOSIS — R110 Nausea: Secondary | ICD-10-CM | POA: Insufficient documentation

## 2015-06-25 DIAGNOSIS — Z79899 Other long term (current) drug therapy: Secondary | ICD-10-CM | POA: Insufficient documentation

## 2015-06-25 DIAGNOSIS — Z794 Long term (current) use of insulin: Secondary | ICD-10-CM | POA: Insufficient documentation

## 2015-06-25 DIAGNOSIS — Z7984 Long term (current) use of oral hypoglycemic drugs: Secondary | ICD-10-CM | POA: Insufficient documentation

## 2015-06-25 LAB — COMPREHENSIVE METABOLIC PANEL
ALK PHOS: 56 U/L (ref 38–126)
ALT: 33 U/L (ref 17–63)
ANION GAP: 10 (ref 5–15)
AST: 34 U/L (ref 15–41)
Albumin: 3.6 g/dL (ref 3.5–5.0)
BUN: 9 mg/dL (ref 6–20)
CALCIUM: 8.8 mg/dL — AB (ref 8.9–10.3)
CHLORIDE: 103 mmol/L (ref 101–111)
CO2: 23 mmol/L (ref 22–32)
Creatinine, Ser: 1.07 mg/dL (ref 0.61–1.24)
GFR calc non Af Amer: >60 mL/min (ref >60)
Glucose, Bld: 232 mg/dL — ABNORMAL HIGH (ref 65–99)
Potassium: 3.7 mmol/L (ref 3.5–5.1)
SODIUM: 136 mmol/L (ref 135–145)
Total Bilirubin: 0.6 mg/dL (ref 0.3–1.2)
Total Protein: 7.2 g/dL (ref 6.5–8.1)

## 2015-06-25 LAB — CBC
HCT: 44.8 % (ref 39.0–52.0)
HEMOGLOBIN: 15.5 g/dL (ref 13.0–17.0)
MCH: 31.2 pg (ref 26.0–34.0)
MCHC: 34.6 g/dL (ref 30.0–36.0)
MCV: 90.1 fL (ref 78.0–100.0)
Platelets: 195 10*3/uL (ref 150–400)
RBC: 4.97 MIL/uL (ref 4.22–5.81)
RDW: 11.9 % (ref 11.5–15.5)
WBC: 8.5 10*3/uL (ref 4.0–10.5)

## 2015-06-25 LAB — LIPASE, BLOOD: LIPASE: 27 U/L (ref 11–51)

## 2015-06-25 MED ORDER — ACETAMINOPHEN 325 MG PO TABS
650.0000 mg | ORAL_TABLET | Freq: Once | ORAL | Status: AC | PRN
  Administered 2015-06-25: 650 mg via ORAL

## 2015-06-25 MED ORDER — ACETAMINOPHEN 325 MG PO TABS: 650.0000 mg | ORAL_TABLET | Freq: Four times a day (QID) | ORAL | Status: DC | PRN

## 2015-06-25 MED ORDER — ACETAMINOPHEN 325 MG PO TABS
ORAL_TABLET | ORAL | Status: AC
  Filled 2015-06-25: qty 2

## 2015-06-25 MED ORDER — ACETAMINOPHEN 325 MG PO TABS
650.0000 mg | ORAL_TABLET | Freq: Once | ORAL | Status: AC
  Administered 2015-06-25: 650 mg via ORAL
  Filled 2015-06-25: qty 2

## 2015-06-25 NOTE — ED Notes (Signed)
Per EMS, pt coming in for generalized abd pain with nausea. Pt reports he was seen for the same yesterday. Pt alert x4. NAD at this time.

## 2015-06-25 NOTE — Discharge Instructions (Signed)
Take medications as prescribed. Return to the emergency room for worsening condition or new concerning symptoms. Follow up with your regular doctor. If you don't have a regular doctor use one of the numbers below to establish a primary care doctor. ° ° °Emergency Department Resource Guide °1) Find a Doctor and Pay Out of Pocket °Although you won't have to find out who is covered by your insurance plan, it is a good idea to ask around and get recommendations. You will then need to call the office and see if the doctor you have chosen will accept you as a new patient and what types of options they offer for patients who are self-pay. Some doctors offer discounts or will set up payment plans for their patients who do not have insurance, but you will need to ask so you aren't surprised when you get to your appointment. ° °2) Contact Your Local Health Department °Not all health departments have doctors that can see patients for sick visits, but many do, so it is worth a call to see if yours does. If you don't know where your local health department is, you can check in your phone book. The CDC also has a tool to help you locate your state's health department, and many state websites also have listings of all of their local health departments. ° °3) Find a Walk-in Clinic °If your illness is not likely to be very severe or complicated, you may want to try a walk in clinic. These are popping up all over the country in pharmacies, drugstores, and shopping centers. They're usually staffed by nurse practitioners or physician assistants that have been trained to treat common illnesses and complaints. They're usually fairly quick and inexpensive. However, if you have serious medical issues or chronic medical problems, these are probably not your best option. ° °No Primary Care Doctor: °- Call [facility] at  832-8000 - they can help you locate a primary care doctor that  accepts your insurance, provides certain services,  etc. °- Physician Referral Service- 1-800-533-3463 ° °Emergency Department Resource Guide °1) Find a Doctor and Pay Out of Pocket °Although you won't have to find out who is covered by your insurance plan, it is a good idea to ask around and get recommendations. You will then need to call the office and see if the doctor you have chosen will accept you as a new patient and what types of options they offer for patients who are self-pay. Some doctors offer discounts or will set up payment plans for their patients who do not have insurance, but you will need to ask so you aren't surprised when you get to your appointment. ° °2) Contact Your Local Health Department °Not all health departments have doctors that can see patients for sick visits, but many do, so it is worth a call to see if yours does. If you don't know where your local health department is, you can check in your phone book. The CDC also has a tool to help you locate your state's health department, and many state websites also have listings of all of their local health departments. ° °3) Find a Walk-in Clinic °If your illness is not likely to be very severe or complicated, you may want to try a walk in clinic. These are popping up all over the country in pharmacies, drugstores, and shopping centers. They're usually staffed by nurse practitioners or physician assistants that have been trained to treat common illnesses and complaints. They're usually fairly   quick and inexpensive. However, if you have serious medical issues or chronic medical problems, these are probably not your best option. ° °No Primary Care Doctor: °- Call [facility] at  832-8000 - they can help you locate a primary care doctor that  accepts your insurance, provides certain services, etc. °- Physician Referral Service- 1-800-533-3463 ° °Chronic Pain Problems: °Organization         Address  Phone   Notes  °[facility]  ([phone]) [phone] Patients need to be referred by  their primary care doctor.  ° °Medication Assistance: °Organization         Address  Phone   Notes  °[place] [facility] [address] °[place], [place] [address] [phone]) [phone] --Must be a resident of [place] °-- Must have NO insurance coverage whatsoever (no Medicaid/ Medicare, etc.) °-- The pt. MUST have a primary care doctor that directs their care regularly and follows them in the community °  °[facility]  (866) 331-1348   °[employer]  (888) 892-1162   ° °Agencies that provide inexpensive medical care: °Organization         Address  Phone   Notes  °[facility]  ([phone]) [phone]   °[facility]    ([phone]) [phone]   °[facility] [address] °[place], [place] [address] ([phone]) [phone]   °[facility] [address], °[place] (336) [phone]   °[facility]    (336) 373-0678   °[facility]    (336) 272-1050   °[facility] ° [address], [place] Phone:  (336) [phone], Fax:  ([phone]) [phone] Hours of Operation:  9 am - 6 pm, M-F.  Also accepts Medicaid/Medicare and self-pay.  °[facility] ° [address], [place] Phone: ([phone]) [phone], Fax: ([phone]) [phone]. Hours of Operation:  8:30 am - 5:30 pm, M-F.  Also accepts Medicaid and self-pay.  °[facility] [place] [address], [place] Phone: ([phone]) [phone]   °[facility] [address], [place], [place] (336)[phone], Ext. 123 Mondays & Thursdays: 7-9 AM.  First 15 patients are seen on a first come, first serve basis. °  ° °Medicaid-accepting [place] Providers: ° °Organization         Address  Phone   Notes  °[facility] [address], [place] ([phone]) [phone] Also accepts self-pay patients.  °[facility] [address], [place] ° ([phone]) [phone]   °[facility]  [address], [place] (336) [phone]   °[facility] [address] [place] [address], [place] ([phone]) [phone]   °[facility] [address], [place]  ° ([phone]) [phone] Only accepts [place] Access Medicaid patients after they have their name applied to their card.  ° °Self-Pay (no insurance) in [place]: ° °Organization         Address  Phone   Notes  °Sickle Cell Patients, [facility] [address], [place] ([phone]) [phone]   °[facility] [address], [place] ([phone]) [phone]   °[facility] ° [address],  ([phone]) [phone]   °[facility]/Dr. Osei-Bonsu ° [address] [place] [address], [place] or [address], [place] [phone]) [phone] Phone number   for both [place] and [place] locations is the same.  °[facility] [address], [place] ([phone]) [phone]   °[facility] [address] [place] [address], [place] or [address] ([phone]) [phone] °(336) [phone]   °[facility] [address], [place] (336) [phone], phone; ([phone]) [phone], fax Sees patients 1st and 3rd Saturday of every month.  Must not qualify for public or private insurance (i.e. Medicaid, Medicare, [place] [employer] Choice, Veterans' Benefits) • Household income should be no more than 200% of the poverty level •The clinic cannot treat you if you are pregnant or think you are pregnant • Sexually transmitted diseases are not treated at the clinic.  ° ° ° °

## 2015-06-25 NOTE — ED Notes (Signed)
[facility]

## 2015-06-25 NOTE — ED Notes (Signed)
Pt states that was seen yesterday at urgent care and dx with a virus but is still having lower back pain and fever/chills. Alert and oriented.

## 2015-06-25 NOTE — ED Provider Notes (Signed)
CSN: [id number]     Arrival date & time 06/25/15  75 History   First MD Initiated Contact with Patient 06/25/15 1917     Chief Complaint  Patient presents with  . Back Pain  . Fever    HPI   Matthew Montgomery is an 55 y.o. male with history of DM who presents to the ED for evaluation of fever, chills, muscle aches. He states he was seen at [facility] yesterday and diagnosed with a virus. He states that he was sent home with nausea medicine but no pain medicine. He states he has continued to feel extreme body aches all over today, particularly in his lower back. He states he thinks he still has a fever and feels very cold. He endorses associated intermittent nausea but denies emesis. Also reports feeling congested. He initially went back to [facility] today where labs were drawn in triage. He apparently felt the wait was too long to see a provider there so came to [facility]. He states he is here for pain medicine rx and then wants to go home. Denies chest pain, sob, urinary symptoms, headache, neck stiffness.   Past Medical History  Diagnosis Date  . Diabetes mellitus without complication ([place]) 0/0938    History reviewed. No pertinent past surgical history. Family History  Problem Relation Age of Onset  . Hypertension Mother   . Diabetes Mother   . Diabetes Father   . Cancer Father    Social History  Substance Use Topics  . Smoking status: Current Every Day Smoker  . Smokeless tobacco: None  . Alcohol Use: Yes    Review of Systems  All other systems reviewed and are negative.     Allergies  Review of patient's allergies indicates no known allergies.  Home Medications   Prior to Admission medications   Medication Sig Start Date End Date Taking? Authorizing Provider  Blood Glucose Monitoring Suppl (TRUERESULT BLOOD GLUCOSE) W/DEVICE KIT 1 each by Does not apply route 3 (three) times daily before meals. 03/08/14   Josalyn Funches, MD  cyclobenzaprine (FLEXERIL) 5 MG tablet Take 1 tablet (5 mg  total) by mouth 3 (three) times daily as needed for muscle spasms. 06/13/15   Billy Fischer, MD  cyclobenzaprine (FLEXERIL) 5 MG tablet Take 1 tablet (5 mg total) by mouth 3 (three) times daily as needed for muscle spasms. 06/13/15   Billy Fischer, MD  diclofenac (CATAFLAM) 50 MG tablet Take 1 tablet (50 mg total) by mouth 3 (three) times daily. As needed for pain 06/13/15   Billy Fischer, MD  glucose blood (TRUETEST TEST) test strip 1 each by Other route 3 (three) times daily. Use as instructed 03/08/14   Boykin Nearing, MD  Insulin Glargine (LANTUS) 100 UNIT/ML Solostar Pen Inject 10 Units into the skin daily before breakfast. 06/07/15   Boykin Nearing, MD  Insulin Syringes, Disposable, U-100 0.5 ML MISC 1 each by Does not apply route 2 (two) times daily before lunch and supper. 03/09/14   Josalyn Funches, MD  ondansetron (ZOFRAN) 4 MG tablet Take 1 tablet (4 mg total) by mouth every 6 (six) hours. 06/24/15   Stevi Barrett, PA-C  sitaGLIPtin (JANUVIA) 100 MG tablet Take 1 tablet (100 mg total) by mouth daily. 01/24/15   Boykin Nearing, MD  TRUEPLUS LANCETS 28G MISC 1 each by Does not apply route 3 (three) times daily. 03/08/14   Josalyn Funches, MD  ULTICARE MICRO PEN NEEDLES 32G X 4 MM MISC USE AS DIRECTED. 03/25/15  Josalyn Funches, MD   BP 122/82 mmHg  Pulse 93  Temp(Src) 101.5 F (38.6 C) (Oral)  Resp 18  Ht '5\' 9"'  (1.753 m)  Wt 101.152 kg  BMI 32.92 kg/m2  SpO2 96% Physical Exam  Constitutional: He is oriented to person, place, and time.  Appears uncomfortable but NAD  HENT:  Right Ear: External ear normal.  Left Ear: External ear normal.  Nose: Nose normal.  Mouth/Throat: Oropharynx is clear and moist. No oropharyngeal exudate.  Eyes: Conjunctivae and EOM are normal. Pupils are equal, round, and reactive to light.  Neck: Normal range of motion. Neck supple.  No meningismus  Cardiovascular: Normal rate, regular rhythm, normal heart sounds and intact distal pulses.   Pulmonary/Chest:  Effort normal and breath sounds normal. No respiratory distress. He has no wheezes. He exhibits no tenderness.  Abdominal: Soft. Bowel sounds are normal. He exhibits no distension. There is no tenderness. There is no rebound and no guarding.  Musculoskeletal: He exhibits no edema.  Diffuse bilateral paraspinal tenderness. No midline back tenderness. No stepoff or deformity.  Neurological: He is alert and oriented to person, place, and time. No cranial nerve deficit.  Skin: Skin is warm and dry.  Psychiatric: He has a normal mood and affect.  Nursing note and vitals reviewed.   ED Course  Procedures (including critical care time) Labs Review Results for orders placed or performed during the hospital encounter of 06/25/15  Lipase, blood  Result Value Ref Range   Lipase 27 11 - 51 U/L  Comprehensive metabolic panel  Result Value Ref Range   Sodium 136 135 - 145 mmol/L   Potassium 3.7 3.5 - 5.1 mmol/L   Chloride 103 101 - 111 mmol/L   CO2 23 22 - 32 mmol/L   Glucose, Bld 232 (H) 65 - 99 mg/dL   BUN 9 6 - 20 mg/dL   Creatinine, Ser 1.07 0.61 - 1.24 mg/dL   Calcium 8.8 (L) 8.9 - 10.3 mg/dL   Total Protein 7.2 6.5 - 8.1 g/dL   Albumin 3.6 3.5 - 5.0 g/dL   AST 34 15 - 41 U/L   ALT 33 17 - 63 U/L   Alkaline Phosphatase 56 38 - 126 U/L   Total Bilirubin 0.6 0.3 - 1.2 mg/dL   GFR calc non Af Amer >60 >60 mL/min   GFR calc Af Amer >60 >60 mL/min   Anion gap 10 5 - 15  CBC  Result Value Ref Range   WBC 8.5 4.0 - 10.5 K/uL   RBC 4.97 4.22 - 5.81 MIL/uL   Hemoglobin 15.5 13.0 - 17.0 g/dL   HCT 44.8 39.0 - 52.0 %   MCV 90.1 78.0 - 100.0 fL   MCH 31.2 26.0 - 34.0 pg   MCHC 34.6 30.0 - 36.0 g/dL   RDW 11.9 11.5 - 15.5 %   Platelets 195 150 - 400 K/uL   No results found.    Imaging Review No results found. I have personally reviewed and evaluated these images and lab results as part of my medical decision-making.   EKG Interpretation None      MDM   Final diagnoses:   Viral syndrome    Labs drawn in [facility] triage reviewed and unremarkable. Agree with yesterday's assesesment that pt with viral illness. Tylenol given in the ED with rx for same. Cr yesterday was mildly elevated, improved today to 1.07. Pt nontoxic on exam and other than fever VS are unremarkable. He is stable for discharge. Resource  guide given to establish PCP for f/u. ER return precautions given.    Anne Ng, PA-C 06/25/15 1943  Wandra Arthurs, MD 06/25/15 2229

## 2015-06-28 ENCOUNTER — Ambulatory Visit (HOSPITAL_COMMUNITY)
Admission: EM | Admit: 2015-06-28 | Discharge: 2015-06-28 | Disposition: A | Discharge status: Home / Self Care | Payer: Self-pay | Attending: Emergency Medicine | Admitting: Emergency Medicine

## 2015-06-28 ENCOUNTER — Encounter (HOSPITAL_COMMUNITY): Payer: Self-pay

## 2015-06-28 ENCOUNTER — Ambulatory Visit (INDEPENDENT_AMBULATORY_CARE_PROVIDER_SITE_OTHER): Payer: Self-pay

## 2015-06-28 DIAGNOSIS — A09 Infectious gastroenteritis and colitis, unspecified: Secondary | ICD-10-CM

## 2015-06-28 DIAGNOSIS — R197 Diarrhea, unspecified: Secondary | ICD-10-CM

## 2015-06-28 DIAGNOSIS — J189 Pneumonia, unspecified organism: Secondary | ICD-10-CM

## 2015-06-28 DIAGNOSIS — R1032 Left lower quadrant pain: Secondary | ICD-10-CM

## 2015-06-28 LAB — OCCULT BLOOD, POC DEVICE: FECAL OCCULT BLD: POSITIVE — AB

## 2015-06-28 LAB — POCT I-STAT, CHEM 8
BUN: 11 mg/dL (ref 6–20)
Calcium, Ion: 1.06 mmol/L — ABNORMAL LOW (ref 1.12–1.23)
Chloride: 93 mmol/L — ABNORMAL LOW (ref 101–111)
Creatinine, Ser: 1.1 mg/dL (ref 0.61–1.24)
Glucose, Bld: 239 mg/dL — ABNORMAL HIGH (ref 65–99)
HEMATOCRIT: 49 % (ref 39.0–52.0)
Hemoglobin: 16.7 g/dL (ref 13.0–17.0)
Potassium: 3.3 mmol/L — ABNORMAL LOW (ref 3.5–5.1)
SODIUM: 133 mmol/L — AB (ref 135–145)
TCO2: 24 mmol/L (ref 0–100)

## 2015-06-28 MED ORDER — IBUPROFEN 800 MG PO TABS
800.0000 mg | ORAL_TABLET | Freq: Once | ORAL | Status: AC
  Administered 2015-06-28: 800 mg via ORAL

## 2015-06-28 MED ORDER — IBUPROFEN 600 MG PO TABS: 600.0000 mg | ORAL_TABLET | Freq: Four times a day (QID) | ORAL | Status: DC | PRN

## 2015-06-28 MED ORDER — AZITHROMYCIN 250 MG PO TABS: 250.0000 mg | ORAL_TABLET | Freq: Every day | ORAL | Status: DC

## 2015-06-28 MED ORDER — METRONIDAZOLE 500 MG PO TABS: 500.0000 mg | ORAL_TABLET | Freq: Three times a day (TID) | ORAL | Status: DC

## 2015-06-28 MED ORDER — LIDOCAINE HCL (PF) 1 % IJ SOLN
INTRAMUSCULAR | Status: AC
  Filled 2015-06-28: qty 5

## 2015-06-28 MED ORDER — SODIUM CHLORIDE 0.9 % IV BOLUS (SEPSIS)
1000.0000 mL | Freq: Once | INTRAVENOUS | Status: AC
  Administered 2015-06-28: 1000 mL via INTRAVENOUS

## 2015-06-28 MED ORDER — CIPROFLOXACIN HCL 500 MG PO TABS: 500.0000 mg | ORAL_TABLET | Freq: Two times a day (BID) | ORAL | Status: DC

## 2015-06-28 MED ORDER — CEFTRIAXONE SODIUM 1 G IJ SOLR
1.0000 g | Freq: Once | INTRAMUSCULAR | Status: AC
  Administered 2015-06-28: 1 g via INTRAMUSCULAR

## 2015-06-28 MED ORDER — AZITHROMYCIN 250 MG PO TABS
500.0000 mg | ORAL_TABLET | Freq: Once | ORAL | Status: AC
  Administered 2015-06-28: 500 mg via ORAL

## 2015-06-28 MED ORDER — CEFTRIAXONE SODIUM 1 G IJ SOLR
INTRAMUSCULAR | Status: AC
  Filled 2015-06-28: qty 10

## 2015-06-28 MED ORDER — AZITHROMYCIN 250 MG PO TABS
ORAL_TABLET | ORAL | Status: AC
  Filled 2015-06-28: qty 2

## 2015-06-28 MED ORDER — IBUPROFEN 800 MG PO TABS
ORAL_TABLET | ORAL | Status: AC
  Filled 2015-06-28: qty 1

## 2015-06-28 NOTE — ED Provider Notes (Signed)
CSN: [id number]     Arrival date & time 06/28/15  1603 History   First MD Initiated Contact with Patient 06/28/15 1634     Chief Complaint  Patient presents with  . Influenza   (Consider location/radiation/quality/duration/timing/severity/associated sxs/prior Treatment) HPI He is a 55 year old man here with his fiance for evaluation of fever. He states for the last 5 days he has been having fevers, chills, body aches, and night sweats. The body aches in particular can be quite severe. He reports some mild nasal congestion and a mild sore throat. He also describes loss of appetite with some occasional vomiting. He reports left-sided abdominal pain. He has had diarrhea with a little bit of blood today. He also reports some cough and shortness of breath. He was seen in the emergency room on both May 1 and May 2 for similar symptoms and diagnosed with a viral illness. He is not improving at all since these visits. He is not taking any medications other than his diabetes medicine and muscle relaxer.  Past Medical History  Diagnosis Date  . Diabetes mellitus without complication ([place]) 07/4401    History reviewed. No pertinent past surgical history. Family History  Problem Relation Age of Onset  . Hypertension Mother   . Diabetes Mother   . Diabetes Father   . Cancer Father    Social History  Substance Use Topics  . Smoking status: Current Every Day Smoker -- 0.50 packs/day    Types: Cigarettes  . Smokeless tobacco: Never Used  . Alcohol Use: Yes     Comment: socially    Review of Systems As in history of present illness Allergies  Review of patient's allergies indicates no known allergies.  Home Medications   Prior to Admission medications   Medication Sig Start Date End Date Taking? Authorizing Provider  acetaminophen (TYLENOL) 325 MG tablet Take 2 tablets (650 mg total) by mouth every 6 (six) hours as needed. 06/25/15  Yes Olivia Canter Sam, PA-C  cyclobenzaprine (FLEXERIL) 5 MG tablet  Take 1 tablet (5 mg total) by mouth 3 (three) times daily as needed for muscle spasms. 06/13/15  Yes Billy Fischer, MD  Insulin Glargine (LANTUS) 100 UNIT/ML Solostar Pen Inject 10 Units into the skin daily before breakfast. 06/07/15  Yes Josalyn Funches, MD  azithromycin (ZITHROMAX Z-PAK) 250 MG tablet Take 1 tablet (250 mg total) by mouth daily. Start 06/29/15 06/28/15   Melony Overly, MD  Blood Glucose Monitoring Suppl (TRUERESULT BLOOD GLUCOSE) W/DEVICE KIT 1 each by Does not apply route 3 (three) times daily before meals. 03/08/14   Josalyn Funches, MD  ciprofloxacin (CIPRO) 500 MG tablet Take 1 tablet (500 mg total) by mouth 2 (two) times daily. 06/28/15   Melony Overly, MD  diclofenac (CATAFLAM) 50 MG tablet Take 1 tablet (50 mg total) by mouth 3 (three) times daily. As needed for pain 06/13/15   Billy Fischer, MD  glucose blood (TRUETEST TEST) test strip 1 each by Other route 3 (three) times daily. Use as instructed 03/08/14   Boykin Nearing, MD  ibuprofen (ADVIL,MOTRIN) 600 MG tablet Take 1 tablet (600 mg total) by mouth every 6 (six) hours as needed for moderate pain. 06/28/15   Melony Overly, MD  Insulin Syringes, Disposable, U-100 0.5 ML MISC 1 each by Does not apply route 2 (two) times daily before lunch and supper. 03/09/14   Josalyn Funches, MD  metroNIDAZOLE (FLAGYL) 500 MG tablet Take 1 tablet (500 mg total) by mouth 3 (  three) times daily. 06/28/15   Melony Overly, MD  ondansetron (ZOFRAN) 4 MG tablet Take 1 tablet (4 mg total) by mouth every 6 (six) hours. 06/24/15   Stevi Barrett, PA-C  sitaGLIPtin (JANUVIA) 100 MG tablet Take 1 tablet (100 mg total) by mouth daily. 01/24/15   Boykin Nearing, MD  TRUEPLUS LANCETS 28G MISC 1 each by Does not apply route 3 (three) times daily. 03/08/14   Boykin Nearing, MD  ULTICARE MICRO PEN NEEDLES 32G X 4 MM MISC USE AS DIRECTED. 03/25/15   Boykin Nearing, MD   Meds Ordered and Administered this Visit   Medications  ibuprofen (ADVIL,MOTRIN) tablet 800 mg (800 mg  Oral Given 06/28/15 1711)  sodium chloride 0.9 % bolus 1,000 mL (1,000 mLs Intravenous Given 06/28/15 1752)  cefTRIAXone (ROCEPHIN) injection 1 g (1 g Intramuscular Given 06/28/15 1743)  azithromycin (ZITHROMAX) tablet 500 mg (500 mg Oral Given 06/28/15 1741)    BP 145/98 mmHg  Pulse 101  Temp(Src) 100.6 F (38.1 C) (Oral)  SpO2 97% No data found.   Physical Exam  Constitutional: He is oriented to person, place, and time. He appears well-developed and well-nourished. No distress.  Appears uncomfortable, but no distress  Neck: Neck supple.  Cardiovascular: Normal rate, regular rhythm and normal heart sounds.   No murmur heard. Pulmonary/Chest: Effort normal. No respiratory distress. He has no wheezes. He has no rales.  Faint crackles in the right mid and lower lung fields  Abdominal: Soft. He exhibits no distension and no mass. There is tenderness (in left upper and lower quadrants). There is no rebound and no guarding.  Increased bowel sounds  Genitourinary: Guaiac positive stool.  He does have some rectal tenderness. Tone is normal. He has an external hemorrhoid at 6:00. This is not inflamed.  Neurological: He is alert and oriented to person, place, and time.    ED Course  Procedures (including critical care time)  Labs Review Labs Reviewed  OCCULT BLOOD, POC DEVICE - Abnormal; Notable for the following:    Fecal Occult Bld POSITIVE (*)    All other components within normal limits  POCT I-STAT, CHEM 8 - Abnormal; Notable for the following:    Sodium 133 (*)    Potassium 3.3 (*)    Chloride 93 (*)    Glucose, Bld 239 (*)    Calcium, Ion 1.06 (*)    All other components within normal limits    Imaging Review Dg Chest 2 View  06/28/2015  CLINICAL DATA:  Sweating.  Fever. EXAM: CHEST  2 VIEW COMPARISON:  None. FINDINGS: Normal cardiac silhouette and mediastinal contours. Ill-defined heterogeneous airspace opacities within the right mid lung worrisome for developing infection. No  pleural effusion or pneumothorax. No evidence of edema. No acute osseus abnormalities. IMPRESSION: Findings worrisome for developing right mid lung pneumonia. A follow-up chest radiograph in 3 to 4 weeks after treatment is recommended to ensure resolution. Electronically Signed   By: Sandi Mariscal M.D.   On: 06/28/2015 17:26     MDM   1. CAP (community acquired pneumonia)   2. Left lower quadrant pain   3. Diarrhea of presumed infectious origin    X-rays concerning for right mid lung pneumonia. This correlates with physical exam findings. Will treat with Rocephin 1 g IM and azithromycin 500 mg by mouth here. Will also get a liter of fluids here as his sodium and chloride are slightly low. Patient reports feeling better after fluids and ibuprofen.  Complete outpatient treatment for  community-acquired pneumonia with 4 additional days of azithromycin. Given has abdominal pain and diarrhea with some blood, will also cover with Cipro and Flagyl for diverticulitis. Ibuprofen as needed for pain. Return precautions reviewed.    Melony Overly, MD 06/28/15 [id number]

## 2015-06-28 NOTE — Discharge Instructions (Signed)
You have pneumonia. I'm also concerned about an infection in your intestine called diverticulitis. Take azithromycin 1 pill daily for 4 days, starting tomorrow. This will finish treatment for the pneumonia. Take Cipro twice a day for 10 days and Flagyl 3 times a day for 10 days for the intestinal infection.  Start these tonight. Use the ibuprofen every 6 hours as needed for body aches. You should start to feel better in the next 48 hours. If you continue to have fevers, have difficulty breathing, your abdominal pain worsens, or you have more blood in your stool, please go to the emergency room.

## 2015-06-28 NOTE — ED Notes (Addendum)
Patient states he is having flu-like symptoms with chills and body aches and night sweats x5 days, Patient has been taking OTC medications, patient states he is seeing blood in stool x2 days No acute distress

## 2015-08-09 ENCOUNTER — Ambulatory Visit: Payer: Self-pay | Attending: Family Medicine | Admitting: Family Medicine

## 2015-08-09 ENCOUNTER — Encounter: Payer: Self-pay | Admitting: Family Medicine

## 2015-08-09 VITALS — BP 117/81 | HR 72 | Temp 97.7°F | Resp 16 | Ht 69.0 in | Wt 204.0 lb

## 2015-08-09 DIAGNOSIS — R195 Other fecal abnormalities: Secondary | ICD-10-CM | POA: Insufficient documentation

## 2015-08-09 DIAGNOSIS — Z0000 Encounter for general adult medical examination without abnormal findings: Secondary | ICD-10-CM

## 2015-08-09 DIAGNOSIS — B353 Tinea pedis: Secondary | ICD-10-CM | POA: Insufficient documentation

## 2015-08-09 DIAGNOSIS — J189 Pneumonia, unspecified organism: Secondary | ICD-10-CM | POA: Insufficient documentation

## 2015-08-09 DIAGNOSIS — L850 Acquired ichthyosis: Secondary | ICD-10-CM

## 2015-08-09 DIAGNOSIS — Z79899 Other long term (current) drug therapy: Secondary | ICD-10-CM | POA: Insufficient documentation

## 2015-08-09 DIAGNOSIS — Z1159 Encounter for screening for other viral diseases: Secondary | ICD-10-CM

## 2015-08-09 DIAGNOSIS — F17210 Nicotine dependence, cigarettes, uncomplicated: Secondary | ICD-10-CM | POA: Insufficient documentation

## 2015-08-09 DIAGNOSIS — Z114 Encounter for screening for human immunodeficiency virus [HIV]: Secondary | ICD-10-CM

## 2015-08-09 DIAGNOSIS — E118 Type 2 diabetes mellitus with unspecified complications: Secondary | ICD-10-CM | POA: Insufficient documentation

## 2015-08-09 DIAGNOSIS — Z794 Long term (current) use of insulin: Secondary | ICD-10-CM | POA: Insufficient documentation

## 2015-08-09 DIAGNOSIS — L853 Xerosis cutis: Secondary | ICD-10-CM

## 2015-08-09 LAB — CBC
HEMATOCRIT: 47.5 % (ref 38.5–50.0)
HEMOGLOBIN: 16 g/dL (ref 13.2–17.1)
MCH: 31.4 pg (ref 27.0–33.0)
MCHC: 33.7 g/dL (ref 32.0–36.0)
MCV: 93.1 fL (ref 80.0–100.0)
MPV: 9.2 fL (ref 7.5–12.5)
Platelets: 283 10*3/uL (ref 140–400)
RBC: 5.1 MIL/uL (ref 4.20–5.80)
RDW: 14.3 % (ref 11.0–15.0)
WBC: 6.1 10*3/uL (ref 3.8–10.8)

## 2015-08-09 LAB — POCT GLYCOSYLATED HEMOGLOBIN (HGB A1C)

## 2015-08-09 LAB — GLUCOSE, POCT (MANUAL RESULT ENTRY): POC Glucose: 157 mg/dl — AB (ref 70–99)

## 2015-08-09 MED ORDER — SITAGLIPTIN PHOSPHATE 100 MG PO TABS: 100.0000 mg | ORAL_TABLET | Freq: Every day | ORAL | Status: DC

## 2015-08-09 MED ORDER — TERBINAFINE HCL 1 % EX CREA: 1.0000 "application " | TOPICAL_CREAM | Freq: Two times a day (BID) | CUTANEOUS | Status: DC

## 2015-08-09 MED ORDER — INSULIN PEN NEEDLE 31G X 8 MM MISC: 1.0000 "application " | Freq: Every day | Status: DC

## 2015-08-09 MED ORDER — INSULIN GLARGINE 100 UNIT/ML SOLOSTAR PEN: 10.0000 [IU] | PEN_INJECTOR | Freq: Every day | SUBCUTANEOUS | Status: DC

## 2015-08-09 MED ORDER — AMMONIUM LACTATE 12 % EX CREA: TOPICAL_CREAM | CUTANEOUS | Status: DC | PRN

## 2015-08-09 NOTE — Progress Notes (Signed)
Subjective:  Patient ID: Matthew Montgomery, male    DOB: September 14, 1960  Age: 55 y.o. MRN: [id number]  CC: Diabetes and Pneumonia   HPI JANTZEN PILGER presents for   1. Diabetes type 2: compliant with regimen. No low CBGs. No polyuria or polydipsia. No vision changes.   2. HFU Pneumonia: patient seen at [facility] and ED multiple times from end of April to May. He was diagnosed with R mild lung PNA and infectious diarrhea  on 06/28/15. He was treated with cipro/flagyl/azithromycin. He is feeling much better. No longer with chills or diarrhea. Stools are still loose but non bloody. He has a c-scope at age 45 while incarcerated. He reports that he was told it was normal and he would need a repeat in 5 years.    Social History  Substance Use Topics  . Smoking status: Current Every Day Smoker -- 0.50 packs/day    Types: Cigarettes  . Smokeless tobacco: Never Used  . Alcohol Use: Yes     Comment: socially   Outpatient Prescriptions Prior to Visit  Medication Sig Dispense Refill  . acetaminophen (TYLENOL) 325 MG tablet Take 2 tablets (650 mg total) by mouth every 6 (six) hours as needed. 30 tablet 0  . azithromycin (ZITHROMAX Z-PAK) 250 MG tablet Take 1 tablet (250 mg total) by mouth daily. Start 06/29/15 6 tablet 0  . Blood Glucose Monitoring Suppl (TRUERESULT BLOOD GLUCOSE) W/DEVICE KIT 1 each by Does not apply route 3 (three) times daily before meals. 1 each 0  . ciprofloxacin (CIPRO) 500 MG tablet Take 1 tablet (500 mg total) by mouth 2 (two) times daily. 20 tablet 0  . cyclobenzaprine (FLEXERIL) 5 MG tablet Take 1 tablet (5 mg total) by mouth 3 (three) times daily as needed for muscle spasms. 30 tablet 0  . diclofenac (CATAFLAM) 50 MG tablet Take 1 tablet (50 mg total) by mouth 3 (three) times daily. As needed for pain 30 tablet 0  . glucose blood (TRUETEST TEST) test strip 1 each by Other route 3 (three) times daily. Use as instructed 100 each 12  . ibuprofen (ADVIL,MOTRIN) 600 MG tablet Take 1 tablet  (600 mg total) by mouth every 6 (six) hours as needed for moderate pain. 30 tablet 0  . Insulin Glargine (LANTUS) 100 UNIT/ML Solostar Pen Inject 10 Units into the skin daily before breakfast. 15 mL 0  . Insulin Syringes, Disposable, U-100 0.5 ML MISC 1 each by Does not apply route 2 (two) times daily before lunch and supper. 100 each 5  . metroNIDAZOLE (FLAGYL) 500 MG tablet Take 1 tablet (500 mg total) by mouth 3 (three) times daily. 30 tablet 0  . ondansetron (ZOFRAN) 4 MG tablet Take 1 tablet (4 mg total) by mouth every 6 (six) hours. 12 tablet 0  . sitaGLIPtin (JANUVIA) 100 MG tablet Take 1 tablet (100 mg total) by mouth daily. 30 tablet 5  . TRUEPLUS LANCETS 28G MISC 1 each by Does not apply route 3 (three) times daily. 100 each 12  . ULTICARE MICRO PEN NEEDLES 32G X 4 MM MISC USE AS DIRECTED. 100 each 3   No facility-administered medications prior to visit.    ROS Review of Systems  Constitutional: Negative for fever, chills, fatigue and unexpected weight change.  HENT: Positive for dental problem.   Eyes: Negative for visual disturbance.  Respiratory: Negative for cough and shortness of breath.   Cardiovascular: Negative for chest pain, palpitations and leg swelling.  Gastrointestinal: Negative for nausea,  vomiting, abdominal pain, diarrhea, constipation and blood in stool.  Endocrine: Negative for polydipsia, polyphagia and polyuria.  Musculoskeletal: Negative for myalgias, back pain, arthralgias, gait problem and neck pain.  Skin: Negative for rash.  Allergic/Immunologic: Negative for immunocompromised state.  Hematological: Negative for adenopathy. Does not bruise/bleed easily.  Psychiatric/Behavioral: Negative for suicidal ideas, sleep disturbance and dysphoric mood. The patient is not nervous/anxious.     Objective:  BP 117/81 mmHg  Pulse 72  Temp(Src) 97.7 F (36.5 C) (Oral)  Resp 16  Ht '5\' 9"'  (1.753 m)  Wt 204 lb (92.534 kg)  BMI 30.11 kg/m2  SpO2 96%  BP/Weight  08/09/2015 04/03/9240 07/31/3417  Systolic BP 622 297 989  Diastolic BP 81 98 90  Wt. (Lbs) 204 - 223  BMI 30.11 - 32.92    Physical Exam  Constitutional: He appears well-developed and well-nourished. No distress.  HENT:  Head: Normocephalic and atraumatic.  Neck: Normal range of motion. Neck supple.  Cardiovascular: Normal rate, regular rhythm, normal heart sounds and intact distal pulses.   Pulmonary/Chest: Effort normal and breath sounds normal.  Genitourinary: Prostate normal and penis normal. Guaiac positive stool.  Musculoskeletal: He exhibits no edema.  Neurological: He is alert.  Skin: Skin is warm and dry. No rash noted. No erythema.     Psychiatric: He has a normal mood and affect.   Lab Results  Component Value Date   HGBA1C 6.10 01/24/2015   Lab Results  Component Value Date   HGBA1C 6.74f06/16/2017    CBG 157  Assessment & Plan:   ORica Motewas seen today for diabetes and pneumonia.  Diagnoses and all orders for this visit:  Controlled type 2 diabetes mellitus with complication, with long-term current use of insulin (HCC) -     HgB A1c -     Glucose (CBG) -     Microalbumin/Creatinine Ratio, Urine -     Insulin Pen Needle (B-D ULTRAFINE III SHORT PEN) 31G X 8 MM MISC; 1 application by Does not apply route daily.  CAP (community acquired pneumonia) -     DG Chest 2 View; Future  Need for hepatitis C screening test -     Hepatitis C antibody, reflex  Screening for HIV (human immunodeficiency virus) -     HIV antibody (with reflex)  Healthcare maintenance -     Ambulatory referral to Gastroenterology  Heme positive stool -     Ambulatory referral to Gastroenterology -     CBC  Controlled type 2 diabetes mellitus with complication, without long-term current use of insulin (HCC) -     sitaGLIPtin (JANUVIA) 100 MG tablet; Take 1 tablet (100 mg total) by mouth daily. -     Insulin Glargine (LANTUS) 100 UNIT/ML Solostar Pen; Inject 10 Units into the skin  daily before breakfast.  Dry skin dermatitis -     ammonium lactate (LAC-HYDRIN) 12 % cream; Apply topically as needed for dry skin.  Tinea pedis, right -     terbinafine (LAMISIL AT) 1 % cream; Apply 1 application topically 2 (two) times daily.  Other orders -     Hepatitis C antibody    No orders of the defined types were placed in this encounter.    Follow-up: No Follow-up on file.   JBoykin NearingMD

## 2015-08-09 NOTE — Patient Instructions (Addendum)
Matthew Montgomery was seen today for diabetes and pneumonia.  Diagnoses and all orders for this visit:  Controlled type 2 diabetes mellitus with complication, with long-term current use of insulin (HCC) -     HgB A1c -     Glucose (CBG) -     Microalbumin/Creatinine Ratio, Urine -     Insulin Pen Needle (B-D ULTRAFINE III SHORT PEN) 31G X 8 MM MISC; 1 application by Does not apply route daily.  CAP (community acquired pneumonia) -     DG Chest 2 View; Future  Need for hepatitis C screening test -     Hepatitis C antibody, reflex  Screening for HIV (human immunodeficiency virus) -     HIV antibody (with reflex)  Healthcare maintenance -     Ambulatory referral to Gastroenterology  Heme positive stool -     Ambulatory referral to Gastroenterology -     CBC  Controlled type 2 diabetes mellitus with complication, without long-term current use of insulin (HCC) -     sitaGLIPtin (JANUVIA) 100 MG tablet; Take 1 tablet (100 mg total) by mouth daily. -     Insulin Glargine (LANTUS) 100 UNIT/ML Solostar Pen; Inject 10 Units into the skin daily before breakfast.  Dry skin dermatitis -     ammonium lactate (LAC-HYDRIN) 12 % cream; Apply topically as needed for dry skin.  Tinea pedis, right -     terbinafine (LAMISIL AT) 1 % cream; Apply 1 application topically 2 (two) times daily.   We will send off for your previous colonoscopy  I am referring you to GI as there is still some microscopic blood in your stool. Please call back to GI.  F/u in 3 months for diabetes   Dr. Armen PickupFunches

## 2015-08-09 NOTE — Progress Notes (Signed)
F/U DM and pneumonia Taking medication daily C/C back pain, low abdominal pain  Blood on rectal area no pain  Pain scale #6 Tobacco user 3-4 cigarette per day  No suicidal thought in the past two weeks

## 2015-08-10 LAB — MICROALBUMIN / CREATININE URINE RATIO
CREATININE, URINE: 222 mg/dL (ref 20–370)
Microalb Creat Ratio: 2 mcg/mg creat (ref <30)
Microalb, Ur: 0.5 mg/dL

## 2015-08-10 LAB — HEPATITIS C ANTIBODY: HCV AB: NEGATIVE

## 2015-08-12 ENCOUNTER — Telehealth: Payer: Self-pay | Admitting: *Deleted

## 2015-08-12 LAB — HIV ANTIBODY (ROUTINE TESTING W REFLEX): HIV: NONREACTIVE

## 2015-08-12 NOTE — Assessment & Plan Note (Signed)
Remains controlled Continue current regimen  

## 2015-08-12 NOTE — Telephone Encounter (Signed)
-----   Message from Dessa PhiJosalyn Funches, MD sent at 08/12/2015  7:47 AM EDT ----- All labs normal Screening HIV and hep C negative

## 2015-08-12 NOTE — Telephone Encounter (Signed)
Unable to contact pt  No voice mail available

## 2015-08-12 NOTE — Assessment & Plan Note (Signed)
Resolved F/u CXR ordered

## 2015-08-12 NOTE — Assessment & Plan Note (Signed)
GI referral for c-scope Cbc Patient signed release so I could obtain and review his previous c-scope

## 2015-08-14 ENCOUNTER — Other Ambulatory Visit: Payer: Self-pay | Admitting: Family Medicine

## 2015-08-30 ENCOUNTER — Other Ambulatory Visit: Payer: Self-pay

## 2015-08-30 DIAGNOSIS — E118 Type 2 diabetes mellitus with unspecified complications: Secondary | ICD-10-CM

## 2015-08-30 MED ORDER — SITAGLIPTIN PHOSPHATE 100 MG PO TABS: 100.0000 mg | ORAL_TABLET | Freq: Every day | ORAL | Status: DC

## 2015-08-30 MED ORDER — ACETAMINOPHEN 325 MG PO TABS: 650.0000 mg | ORAL_TABLET | Freq: Four times a day (QID) | ORAL | Status: DC | PRN

## 2015-10-24 ENCOUNTER — Encounter: Payer: Self-pay | Admitting: Family Medicine

## 2016-01-27 ENCOUNTER — Other Ambulatory Visit: Payer: Self-pay | Admitting: Family Medicine

## 2016-01-27 DIAGNOSIS — E118 Type 2 diabetes mellitus with unspecified complications: Secondary | ICD-10-CM

## 2016-02-07 ENCOUNTER — Ambulatory Visit (HOSPITAL_COMMUNITY)
Admission: EM | Admit: 2016-02-07 | Discharge: 2016-02-07 | Disposition: A | Discharge status: Home / Self Care | Payer: Self-pay | Attending: Internal Medicine | Admitting: Internal Medicine

## 2016-02-07 ENCOUNTER — Ambulatory Visit (INDEPENDENT_AMBULATORY_CARE_PROVIDER_SITE_OTHER): Payer: Self-pay

## 2016-02-07 ENCOUNTER — Encounter (HOSPITAL_COMMUNITY): Payer: Self-pay | Admitting: *Deleted

## 2016-02-07 DIAGNOSIS — R05 Cough: Secondary | ICD-10-CM

## 2016-02-07 DIAGNOSIS — R059 Cough, unspecified: Secondary | ICD-10-CM

## 2016-02-07 DIAGNOSIS — J988 Other specified respiratory disorders: Secondary | ICD-10-CM

## 2016-02-07 MED ORDER — AZITHROMYCIN 250 MG PO TABS: 250.0000 mg | ORAL_TABLET | Freq: Every day | ORAL | 0 refills | Status: DC

## 2016-02-07 MED ORDER — HYDROCODONE-HOMATROPINE 5-1.5 MG/5ML PO SYRP: 5.0000 mL | ORAL_SOLUTION | Freq: Four times a day (QID) | ORAL | 0 refills | Status: DC | PRN

## 2016-02-07 NOTE — ED Triage Notes (Signed)
Cough    /  Body  Aches   Chills   Diarrhea  This  Am          stmptoms  Of  The  Cough  And  Chills  Started  Several  Days  Ago     Pt  Is  Awake  And  Alert and  Oriented  Appearing in     Some  Distress

## 2016-02-07 NOTE — Discharge Instructions (Signed)
You has a respiratory infection. Xrays were normal, but because of your history of pneumonia/ and current smoker giving you an antibiotic. May use cough medication at night as needed. Delsym OTC during the day. Mucinex Max strength as directed will help with loosening up phlegm. F/U if worsens,. Stay hydrated.

## 2016-02-07 NOTE — ED Provider Notes (Signed)
CSN: [id number]     Arrival date & time 02/07/16  1651 History   First MD Initiated Contact with Patient 02/07/16 1712     Chief Complaint  Patient presents with  . Cough   (Consider location/radiation/quality/duration/timing/severity/associated sxs/prior Treatment) 55 yo1/2 ppd smoker presents with non-productive cough x 3 days, malaise and chills. No known fever. He carries a history of pneumonia earlier this year and "feels the same way". He has noted some mild diarrhea without abdominal pain or bloody stools. See full remainder of ROS      Past Medical History:  Diagnosis Date  . 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 Decreased visual acuity 10/01/2006  . Diabetes mellitus without complication ([place]) 04/7340    History reviewed. No pertinent surgical history. Family History  Problem Relation Age of Onset  . Hypertension Mother   . Diabetes Mother   . Diabetes Father   . Cancer Father    Social History  Substance Use Topics  . Smoking status: Current Every Day Smoker    Packs/day: 0.50    Types: Cigarettes  . Smokeless tobacco: Never Used  . Alcohol use Yes     Comment: socially    Review of Systems  Constitutional: Positive for chills and fatigue. Negative for fever.  HENT: Negative.   Respiratory: Positive for cough. Negative for shortness of breath and wheezing.   Psychiatric/Behavioral: Negative.     Allergies  Patient has no known allergies.  Home Medications   Prior to Admission medications   Medication Sig Start Date End Date Taking? Authorizing Provider  acetaminophen (TYLENOL) 325 MG tablet Take 2 tablets (650 mg total) by mouth every 6 (six) hours as needed. 08/30/15   Josalyn Funches, MD  ammonium lactate (LAC-HYDRIN) 12 % cream Apply topically as needed for dry skin. 08/09/15   Josalyn Funches, MD  azithromycin (ZITHROMAX) 250 MG tablet Take 1 tablet (250 mg total) by mouth daily. Take first 2 tablets together, then 1 every day until finished. 02/07/16   Bjorn Pippin, PA-C  Blood Glucose Monitoring Suppl (TRUERESULT BLOOD GLUCOSE) W/DEVICE KIT 1 each by Does not apply route 3 (three) times daily before meals. 03/08/14   Josalyn Funches, MD  cyclobenzaprine (FLEXERIL) 5 MG tablet Take 1 tablet (5 mg total) by mouth 3 (three) times daily as needed for muscle spasms. 06/13/15   Billy Fischer, MD  diclofenac (CATAFLAM) 50 MG tablet Take 1 tablet (50 mg total) by mouth 3 (three) times daily. As needed for pain 06/13/15   Billy Fischer, MD  glucose blood (TRUETEST TEST) test strip 1 each by Other route 3 (three) times daily. Use as instructed 03/08/14   Boykin Nearing, MD  HYDROcodone-homatropine (HYCODAN) 5-1.5 MG/5ML syrup Take 5 mLs by mouth every 6 (six) hours as needed for cough. 02/07/16   Bjorn Pippin, PA-C  ibuprofen (ADVIL,MOTRIN) 600 MG tablet Take 1 tablet (600 mg total) by mouth every 6 (six) hours as needed for moderate pain. 06/28/15   Melony Overly, MD  Insulin Glargine (LANTUS) 100 UNIT/ML Solostar Pen Inject 10 Units into the skin daily before breakfast. 08/09/15   Josalyn Funches, MD  Insulin Pen Needle (B-D ULTRAFINE III SHORT PEN) 31G X 8 MM MISC 1 application by Does not apply route daily. 08/09/15   Josalyn Funches, MD  LANTUS SOLOSTAR 100 UNIT/ML Solostar Pen INJECT 10 UNITS INTO THE SKIN DAILY BEFORE BREAKFAST. 01/27/16   Josalyn Funches, MD  sitaGLIPtin (JANUVIA) 100 MG tablet Take 1 tablet (100 mg  total) by mouth daily. 08/30/15   Josalyn Funches, MD  terbinafine (LAMISIL AT) 1 % cream Apply 1 application topically 2 (two) times daily. 08/09/15   Boykin Nearing, MD  TRUEPLUS INSULIN SYRINGE 30G X 5/16" 0.5 ML MISC USE WITH INSULIN 2 TIMES A DAY BEFORE LUNCH AND SUPPER 08/14/15   Boykin Nearing, MD  TRUEPLUS LANCETS 28G MISC 1 each by Does not apply route 3 (three) times daily. 03/08/14   Boykin Nearing, MD   Meds Ordered and Administered this Visit  Medications - No data to display  BP 130/96 (BP Location: Right Arm)   Pulse 78   Temp 98.6  F (37 C) (Oral)   Resp 18   SpO2 100%  No data found.   Physical Exam  Constitutional: He is oriented to person, place, and time. He appears well-developed and well-nourished. No distress.  HENT:  Head: Normocephalic.  Right Ear: External ear normal.  Left Ear: External ear normal.  Mouth/Throat: Oropharynx is clear and moist.  Eyes: Right eye exhibits no discharge. Left eye exhibits no discharge.  Neck: Normal range of motion.  Cardiovascular: Normal rate and regular rhythm.   Pulmonary/Chest: Effort normal.  Few rhonchi and crackles in both bases, mild exp wheeze throughout, not cleared with cough  Abdominal: Soft. Bowel sounds are normal. He exhibits no distension. There is no tenderness. There is no rebound.  Lymphadenopathy:    He has no cervical adenopathy.  Neurological: He is alert and oriented to person, place, and time.  Skin: Skin is warm and dry. He is not diaphoretic.  Psychiatric: His behavior is normal.  Nursing note and vitals reviewed.   Urgent Care Course   Clinical Course     Procedures (including critical care time)  Labs Review Labs Reviewed - No data to display  Imaging Review Dg Chest 2 View  Result Date: 02/07/2016 CLINICAL DATA:  Cough, chest pain and fever for 3 days EXAM: CHEST  2 VIEW COMPARISON:  Chest radiograph 06/28/2015 FINDINGS: Cardiomediastinal contours are normal. No pneumothorax or pleural effusion. No focal airspace consolidation or pulmonary edema. Previously seen right mid lung opacities have resolved. IMPRESSION: Clear lungs. Electronically Signed   By: Ulyses Jarred M.D.   On: 02/07/2016 17:53     Visual Acuity Review  Right Eye Distance:   Left Eye Distance:   Bilateral Distance:    Right Eye Near:   Left Eye Near:    Bilateral Near:         MDM   1. Respiratory infection   2. Cough    CXR normal, but based on symptoms, exam and smoking history will cover with Azithromycin. Along with symptomatic relief and  hydration. F/U with any concerns of worsening symptoms. Work note also given.     Bjorn Pippin, PA-C 02/07/16 [phone]

## 2016-04-02 ENCOUNTER — Telehealth: Payer: Self-pay | Admitting: Family Medicine

## 2016-04-02 DIAGNOSIS — Z794 Long term (current) use of insulin: Principal | ICD-10-CM

## 2016-04-02 DIAGNOSIS — E118 Type 2 diabetes mellitus with unspecified complications: Secondary | ICD-10-CM

## 2016-04-02 MED ORDER — INSULIN PEN NEEDLE 31G X 8 MM MISC: 1.0000 "application " | Freq: Every day | 3 refills | Status: DC

## 2016-04-02 NOTE — Telephone Encounter (Signed)
Patient called the office to request medication refill for Insulin Pen Needle (B-D ULTRAFINE III SHORT PEN) 31G X 8 MM MISC.  Thank you.

## 2016-05-12 ENCOUNTER — Telehealth: Payer: Self-pay | Admitting: Family Medicine

## 2016-05-12 DIAGNOSIS — E118 Type 2 diabetes mellitus with unspecified complications: Secondary | ICD-10-CM

## 2016-05-12 MED ORDER — INSULIN GLARGINE 100 UNIT/ML SOLOSTAR PEN: 10.0000 [IU] | PEN_INJECTOR | Freq: Every day | SUBCUTANEOUS | 0 refills | Status: DC

## 2016-05-12 NOTE — Telephone Encounter (Signed)
Patient called the office to request medication refill for LANTUS SOLOSTAR 100 UNIT/ML Solostar Pen. Please call it in to our pharmacy.  Thank you.

## 2016-05-12 NOTE — Telephone Encounter (Signed)
Lantus refill sent to [facility] pharmacy

## 2016-05-13 ENCOUNTER — Ambulatory Visit (HOSPITAL_COMMUNITY)
Admission: EM | Admit: 2016-05-13 | Discharge: 2016-05-13 | Disposition: A | Discharge status: Home / Self Care | Payer: Self-pay | Attending: Emergency Medicine | Admitting: Emergency Medicine

## 2016-05-13 ENCOUNTER — Encounter (HOSPITAL_COMMUNITY): Payer: Self-pay | Admitting: *Deleted

## 2016-05-13 DIAGNOSIS — E162 Hypoglycemia, unspecified: Secondary | ICD-10-CM

## 2016-05-13 LAB — POCT I-STAT, CHEM 8
BUN: 16 mg/dL (ref 6–20)
CREATININE: 1.1 mg/dL (ref 0.61–1.24)
Calcium, Ion: 1.19 mmol/L (ref 1.15–1.40)
Chloride: 107 mmol/L (ref 101–111)
GLUCOSE: 140 mg/dL — AB (ref 65–99)
HCT: 48 % (ref 39.0–52.0)
HEMOGLOBIN: 16.3 g/dL (ref 13.0–17.0)
POTASSIUM: 3.8 mmol/L (ref 3.5–5.1)
Sodium: 141 mmol/L (ref 135–145)
TCO2: 23 mmol/L (ref 0–100)

## 2016-05-13 NOTE — Discharge Instructions (Signed)
Please do not take NovoLog as a replacement for your Lantus. If your blood sugars drop below 70 please follow-up sooner with your PCP. You need to follow-up with your PCP in 1 week.

## 2016-05-13 NOTE — ED Triage Notes (Signed)
Pt  Reports      Symptoms     Of   Weakness        Pt  Took   novalog  Insulin      Today    Last  Blood  Sugar  Was  8o  About  1  Hour  Ago     Pt  Is  Awake  And  Alert

## 2016-05-13 NOTE — ED Provider Notes (Signed)
CSN: [id number]     Arrival date & time 05/13/16  1058 History   None    Chief Complaint  Patient presents with  . Blood Sugar Problem   (Consider location/radiation/quality/duration/timing/severity/associated sxs/prior Treatment) Patient with history of diabetes presenting with hypoglycemia down to 87. Patient states that he has been out of his Lantus for 2 days, he was taking 10 units of Lantus previously. He decided to substitute his Lantus with NovoLog 10 units this a.m. He became diaphoretic nauseated, and fatigue. Patient drank a soda and ate a snack and then checked his blood sugars and they were 87. Patient is currently on Lantus 10 units and Januvia 100 mg, his last A1c was 6.6. He previously had significantly uncontrolled diabetes however he lost a lot of weight and improved his exercise regiment and was no longer requiring NovoLog and Lantus. He therefore had left over NovoLog from when he was previously requiring mealtime insulin. No chest pain, abdominal pain, fevers or chills.      Past Medical History:  Diagnosis Date  . 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 Decreased visual acuity 10/01/2006  . Diabetes mellitus without complication ([place]) 07/6061    History reviewed. No pertinent surgical history. Family History  Problem Relation Age of Onset  . Hypertension Mother   . Diabetes Mother   . Diabetes Father   . Cancer Father    Social History  Substance Use Topics  . Smoking status: Current Every Day Smoker    Packs/day: 0.50    Types: Cigarettes  . Smokeless tobacco: Never Used  . Alcohol use Yes     Comment: socially    Review of Systems  Constitutional: Positive for fatigue. Negative for chills and fever.  HENT: Negative for congestion.   Eyes: Negative.   Respiratory: Negative for cough and shortness of breath.   Cardiovascular: Positive for palpitations. Negative for chest pain.  Gastrointestinal: Negative for abdominal pain, constipation and diarrhea.   Musculoskeletal: Negative for arthralgias.  Skin: Negative for rash.    Allergies  Patient has no known allergies.  Home Medications   Prior to Admission medications   Medication Sig Start Date End Date Taking? Authorizing Provider  acetaminophen (TYLENOL) 325 MG tablet Take 2 tablets (650 mg total) by mouth every 6 (six) hours as needed. 08/30/15   Josalyn Funches, MD  ammonium lactate (LAC-HYDRIN) 12 % cream Apply topically as needed for dry skin. 08/09/15   Josalyn Funches, MD  azithromycin (ZITHROMAX) 250 MG tablet Take 1 tablet (250 mg total) by mouth daily. Take first 2 tablets together, then 1 every day until finished. 02/07/16   Bjorn Pippin, PA-C  Blood Glucose Monitoring Suppl (TRUERESULT BLOOD GLUCOSE) W/DEVICE KIT 1 each by Does not apply route 3 (three) times daily before meals. 03/08/14   Josalyn Funches, MD  cyclobenzaprine (FLEXERIL) 5 MG tablet Take 1 tablet (5 mg total) by mouth 3 (three) times daily as needed for muscle spasms. 06/13/15   Billy Fischer, MD  diclofenac (CATAFLAM) 50 MG tablet Take 1 tablet (50 mg total) by mouth 3 (three) times daily. As needed for pain 06/13/15   Billy Fischer, MD  glucose blood (TRUETEST TEST) test strip 1 each by Other route 3 (three) times daily. Use as instructed 03/08/14   Boykin Nearing, MD  HYDROcodone-homatropine (HYCODAN) 5-1.5 MG/5ML syrup Take 5 mLs by mouth every 6 (six) hours as needed for cough. 02/07/16   Bjorn Pippin, PA-C  ibuprofen (ADVIL,MOTRIN) 600 MG tablet Take 1 tablet (  600 mg total) by mouth every 6 (six) hours as needed for moderate pain. 06/28/15   Melony Overly, MD  Insulin Glargine (LANTUS) 100 UNIT/ML Solostar Pen Inject 10 Units into the skin daily before breakfast. 05/12/16   Josalyn Funches, MD  Insulin Pen Needle (B-D ULTRAFINE III SHORT PEN) 31G X 8 MM MISC 1 application by Does not apply route daily. 04/02/16   Josalyn Funches, MD  sitaGLIPtin (JANUVIA) 100 MG tablet Take 1 tablet (100 mg total) by mouth  daily. 08/30/15   Josalyn Funches, MD  terbinafine (LAMISIL AT) 1 % cream Apply 1 application topically 2 (two) times daily. 08/09/15   Boykin Nearing, MD  TRUEPLUS INSULIN SYRINGE 30G X 5/16" 0.5 ML MISC USE WITH INSULIN 2 TIMES A DAY BEFORE LUNCH AND SUPPER 08/14/15   Boykin Nearing, MD  TRUEPLUS LANCETS 28G MISC 1 each by Does not apply route 3 (three) times daily. 03/08/14   Boykin Nearing, MD   Meds Ordered and Administered this Visit  Medications - No data to display  BP 130/70 (BP Location: Right Arm)   Pulse 80   Temp 98.6 F (37 C) (Oral)   Resp 18   SpO2 100%  No data found.   Physical Exam  Constitutional: He is oriented to person, place, and time. He appears well-developed and well-nourished.  HENT:  Head: Normocephalic and atraumatic.  Nose: Nose normal.  Mouth/Throat: Oropharynx is clear and moist.  Eyes: Conjunctivae are normal. Pupils are equal, round, and reactive to light.  Neck: Normal range of motion. Neck supple.  Cardiovascular: Normal rate, regular rhythm, normal heart sounds and intact distal pulses.   Pulmonary/Chest: Effort normal and breath sounds normal.  Abdominal: Soft. Bowel sounds are normal.  Musculoskeletal: Normal range of motion.  Neurological: He is alert and oriented to person, place, and time.  Skin: Skin is warm. Capillary refill takes less than 2 seconds.    Urgent Care Course     Procedures (including critical care time)  Labs Review Labs Reviewed  POCT I-STAT, CHEM 8 - Abnormal; Notable for the following:       Result Value   Glucose, Bld 140 (*)    All other components within normal limits    Imaging Review No results found.    MDM   1. Hypoglycemia    Symptoms of hypoglycemia, patient was taking his left over NovoLog as a substitute for his Lantus morning and developed symptoms of hypoglycemia. CBG when checked after drinking a Coke and eating a sandwich was 87.  Discussed with patient that he should not take NovoLog  as a substitute explained the difference between short-acting and long-acting insulin. Patient understood and indicated that he would throw the NovoLog away. Patient to follow-up with primary care physician in the next week to determine next steps.    Tonette Bihari, MD 05/13/16 1135

## 2016-05-18 ENCOUNTER — Ambulatory Visit: Payer: Self-pay | Attending: Family Medicine

## 2016-05-18 ENCOUNTER — Encounter: Payer: Self-pay | Admitting: Family Medicine

## 2016-06-02 ENCOUNTER — Telehealth: Payer: Self-pay | Admitting: Family Medicine

## 2016-06-02 DIAGNOSIS — E118 Type 2 diabetes mellitus with unspecified complications: Secondary | ICD-10-CM

## 2016-06-02 MED ORDER — INSULIN GLARGINE 100 UNIT/ML SOLOSTAR PEN: 10.0000 [IU] | PEN_INJECTOR | Freq: Every day | SUBCUTANEOUS | 0 refills | Status: DC

## 2016-06-02 NOTE — Telephone Encounter (Signed)
lantus refilled

## 2016-06-02 NOTE — Telephone Encounter (Signed)
Patient called the office to request medication refill for Insulin Glargine (LANTUS) 100 UNIT/ML Solostar Pen. Please call it in to our pharmacy.  Thank you.

## 2016-06-19 ENCOUNTER — Other Ambulatory Visit: Payer: Self-pay

## 2016-06-19 DIAGNOSIS — E118 Type 2 diabetes mellitus with unspecified complications: Secondary | ICD-10-CM

## 2016-06-19 MED ORDER — SITAGLIPTIN PHOSPHATE 100 MG PO TABS: 100.0000 mg | ORAL_TABLET | Freq: Every day | ORAL | 3 refills | Status: DC

## 2016-06-19 MED ORDER — INSULIN GLARGINE 100 UNIT/ML SOLOSTAR PEN: 10.0000 [IU] | PEN_INJECTOR | Freq: Every day | SUBCUTANEOUS | 3 refills | Status: DC

## 2016-06-26 ENCOUNTER — Ambulatory Visit: Payer: Self-pay | Attending: Family Medicine | Admitting: Family Medicine

## 2016-06-26 ENCOUNTER — Encounter: Payer: Self-pay | Admitting: Family Medicine

## 2016-06-26 VITALS — BP 115/67 | HR 88 | Temp 97.5°F | Ht 69.0 in | Wt 200.8 lb

## 2016-06-26 DIAGNOSIS — E118 Type 2 diabetes mellitus with unspecified complications: Secondary | ICD-10-CM | POA: Insufficient documentation

## 2016-06-26 DIAGNOSIS — F17210 Nicotine dependence, cigarettes, uncomplicated: Secondary | ICD-10-CM | POA: Insufficient documentation

## 2016-06-26 DIAGNOSIS — K0889 Other specified disorders of teeth and supporting structures: Secondary | ICD-10-CM | POA: Insufficient documentation

## 2016-06-26 DIAGNOSIS — Z794 Long term (current) use of insulin: Secondary | ICD-10-CM | POA: Insufficient documentation

## 2016-06-26 LAB — POCT GLYCOSYLATED HEMOGLOBIN (HGB A1C): Hemoglobin A1C: 6.1

## 2016-06-26 LAB — GLUCOSE, POCT (MANUAL RESULT ENTRY): POC GLUCOSE: 203 mg/dL — AB (ref 70–99)

## 2016-06-26 MED ORDER — INSULIN PEN NEEDLE 31G X 8 MM MISC: 1.0000 "application " | Freq: Every day | 3 refills | Status: DC

## 2016-06-26 MED ORDER — INSULIN GLARGINE 100 UNIT/ML SOLOSTAR PEN: 10.0000 [IU] | PEN_INJECTOR | Freq: Every day | SUBCUTANEOUS | 3 refills | Status: DC

## 2016-06-26 MED ORDER — SITAGLIPTIN PHOSPHATE 100 MG PO TABS: 100.0000 mg | ORAL_TABLET | Freq: Every day | ORAL | 3 refills | Status: DC

## 2016-06-26 NOTE — Patient Instructions (Addendum)
Matthew Montgomery was seen today for referral.  Diagnoses and all orders for this visit:  Controlled type 2 diabetes mellitus with complication, without long-term current use of insulin (HCC) -     POCT glycosylated hemoglobin (Hb A1C) -     POCT glucose (manual entry) -     Ambulatory referral to Dentistry -     Insulin Glargine (LANTUS) 100 UNIT/ML Solostar Pen; Inject 10 Units into the skin daily before breakfast. -     sitaGLIPtin (JANUVIA) 100 MG tablet; Take 1 tablet (100 mg total) by mouth daily. -     Lipid Panel; Future  Pain of molar -     Ambulatory referral to Dentistry   Return fasting for blood work at your earliest convenience  Smoking cessation support: smoking cessation hotline: 1-800-QUIT-NOW.  Smoking cessation classes are available through [facility]. Call [phone] or visit our website at [email or web address].  Use Sensodyne tooth paste  f/u in 6 months for diabetes, sooner if needed   Dr. Armen PickupFunches

## 2016-06-26 NOTE — Progress Notes (Signed)
Subjective:  Patient ID: Matthew Montgomery, male    DOB: 08-18-1960  Age: 56 y.o. MRN: [id number]  CC: Referral   HPI Matthew Montgomery smoker 1/3 PPD, well controlled diabetes he  presents for   1. Dental referral: request dental referral for molar pain. He loss a filling. He denies oral swelling.   2. CHRONIC DIABETES  Disease Monitoring  Blood Sugar Ranges: not checking   Polyuria: no   Visual problems: no   Medication Compliance: yes  Medication Side Effects  Hypoglycemia: no    Social History  Substance Use Topics  . Smoking status: Current Every Day Smoker    Packs/day: 0.50    Types: Cigarettes  . Smokeless tobacco: Never Used  . Alcohol use Yes     Comment: socially    Outpatient Medications Prior to Visit  Medication Sig Dispense Refill  . acetaminophen (TYLENOL) 325 MG tablet Take 2 tablets (650 mg total) by mouth every 6 (six) hours as needed. 30 tablet 0  . Blood Glucose Monitoring Suppl (TRUERESULT BLOOD GLUCOSE) W/DEVICE KIT 1 each by Does not apply route 3 (three) times daily before meals. 1 each 0  . cyclobenzaprine (FLEXERIL) 5 MG tablet Take 1 tablet (5 mg total) by mouth 3 (three) times daily as needed for muscle spasms. 30 tablet 0  . ibuprofen (ADVIL,MOTRIN) 600 MG tablet Take 1 tablet (600 mg total) by mouth every 6 (six) hours as needed for moderate pain. 30 tablet 0  . Insulin Glargine (LANTUS) 100 UNIT/ML Solostar Pen Inject 10 Units into the skin daily before breakfast. 15 mL 3  . Insulin Pen Needle (B-D ULTRAFINE III SHORT PEN) 31G X 8 MM MISC 1 application by Does not apply route daily. 100 each 3  . sitaGLIPtin (JANUVIA) 100 MG tablet Take 1 tablet (100 mg total) by mouth daily. 90 tablet 3  . terbinafine (LAMISIL AT) 1 % cream Apply 1 application topically 2 (two) times daily. 30 g 0  . TRUEPLUS INSULIN SYRINGE 30G X 5/16" 0.5 ML MISC USE WITH INSULIN 2 TIMES A DAY BEFORE LUNCH AND SUPPER 100 each 5  . TRUEPLUS LANCETS 28G MISC 1 each by Does not  apply route 3 (three) times daily. 100 each 12  . ammonium lactate (LAC-HYDRIN) 12 % cream Apply topically as needed for dry skin. (Patient not taking: Reported on 06/26/2016) 385 g 0  . azithromycin (ZITHROMAX) 250 MG tablet Take 1 tablet (250 mg total) by mouth daily. Take first 2 tablets together, then 1 every day until finished. (Patient not taking: Reported on 06/26/2016) 6 tablet 0  . diclofenac (CATAFLAM) 50 MG tablet Take 1 tablet (50 mg total) by mouth 3 (three) times daily. As needed for pain (Patient not taking: Reported on 06/26/2016) 30 tablet 0  . glucose blood (TRUETEST TEST) test strip 1 each by Other route 3 (three) times daily. Use as instructed 100 each 12  . HYDROcodone-homatropine (HYCODAN) 5-1.5 MG/5ML syrup Take 5 mLs by mouth every 6 (six) hours as needed for cough. (Patient not taking: Reported on 06/26/2016) 120 mL 0   No facility-administered medications prior to visit.     ROS Review of Systems  Constitutional: Negative for chills, fatigue, fever and unexpected weight change.  HENT: Positive for dental problem.   Eyes: Negative for visual disturbance.  Respiratory: Negative for cough and shortness of breath.   Cardiovascular: Negative for chest pain, palpitations and leg swelling.  Gastrointestinal: Negative for abdominal pain, blood in stool, constipation,  diarrhea, nausea and vomiting.  Endocrine: Negative for polydipsia, polyphagia and polyuria.  Musculoskeletal: Negative for arthralgias, back pain, gait problem, myalgias and neck pain.  Skin: Negative for rash.  Allergic/Immunologic: Negative for immunocompromised state.  Hematological: Negative for adenopathy. Does not bruise/bleed easily.  Psychiatric/Behavioral: Negative for dysphoric mood, sleep disturbance and suicidal ideas. The patient is not nervous/anxious.     Objective:  BP 115/67   Pulse 88   Temp 97.5 F (36.4 C) (Oral)   Ht '5\' 9"'  (1.753 m)   Wt 200 lb 12.8 oz (91.1 kg)   SpO2 97%   BMI 29.65  kg/m   BP/Weight 06/26/2016 05/13/2016 76/28/3151  Systolic BP 761 607 371  Diastolic BP 67 70 96  Wt. (Lbs) 200.8 - -  BMI 29.65 - -    Physical Exam  Constitutional: He appears well-developed and well-nourished. No distress.  HENT:  Head: Normocephalic and atraumatic.  Mouth/Throat:    Neck: Normal range of motion. Neck supple.  Cardiovascular: Normal rate, regular rhythm, normal heart sounds and intact distal pulses.   Pulmonary/Chest: Effort normal and breath sounds normal.  Musculoskeletal: He exhibits no edema.  Neurological: He is alert.  Skin: Skin is warm and dry. No rash noted. No erythema.  Psychiatric: He has a normal mood and affect.    Lab Results  Component Value Date   HGBA1C 6.62f06/16/2017   Lab Results  Component Value Date   HGBA1C 6.1 06/26/2016    CBG 203 Assessment & Plan:   Matthew Montgomery seen today for referral.  Diagnoses and all orders for this visit:  Controlled type 2 diabetes mellitus with complication, without long-term current use of insulin [place]     POCT glycosylated hemoglobin (Hb A1C) -     POCT glucose (manual entry) -     Ambulatory referral to Dentistry -     Discontinue: Insulin Glargine (LANTUS) 100 UNIT/ML Solostar Pen; Inject 10 Units into the skin daily before breakfast. -     Discontinue: sitaGLIPtin (JANUVIA) 100 MG tablet; Take 1 tablet (100 mg total) by mouth daily. -     Lipid Panel; Future -     sitaGLIPtin (JANUVIA) 100 MG tablet; Take 1 tablet (100 mg total) by mouth daily. -     Insulin Glargine (LANTUS) 100 UNIT/ML Solostar Pen; Inject 10 Units into the skin daily before breakfast.  Pain of molar -     Ambulatory referral to Dentistry  Controlled type 2 diabetes mellitus with complication, with long-term current use of insulin ([facility]) -     Discontinue: Insulin Pen Needle (B-D ULTRAFINE III SHORT PEN) 31G X 8 MM MISC; 1 application by Does not apply route daily. -     Insulin Pen Needle (B-D ULTRAFINE III SHORT  PEN) 31G X 8 MM MISC; 1 application by Does not apply route daily.    No orders of the defined types were placed in this encounter.   Follow-up: No Follow-up on file.   JBoykin NearingMD

## 2016-06-26 NOTE — Assessment & Plan Note (Signed)
Remain controlled Continue current regimen Smoking cessation

## 2016-06-26 NOTE — Assessment & Plan Note (Signed)
Dental referral placed.

## 2016-07-02 ENCOUNTER — Ambulatory Visit: Payer: Self-pay | Attending: Family Medicine

## 2016-07-02 DIAGNOSIS — E118 Type 2 diabetes mellitus with unspecified complications: Secondary | ICD-10-CM | POA: Insufficient documentation

## 2016-07-02 NOTE — Progress Notes (Signed)
Patient here for lab visit only 

## 2016-07-03 LAB — LIPID PANEL
Chol/HDL Ratio: 3 ratio (ref 0.0–5.0)
Cholesterol, Total: 124 mg/dL (ref 100–199)
HDL: 41 mg/dL (ref >39)
LDL CALC: 71 mg/dL (ref 0–99)
Triglycerides: 61 mg/dL (ref 0–149)
VLDL Cholesterol Cal: 12 mg/dL (ref 5–40)

## 2016-07-06 ENCOUNTER — Telehealth: Payer: Self-pay

## 2016-07-06 NOTE — Telephone Encounter (Signed)
Pt was called and informed of lab results. 

## 2016-07-08 ENCOUNTER — Encounter: Payer: Self-pay | Admitting: Family Medicine

## 2017-03-26 ENCOUNTER — Ambulatory Visit: Payer: Self-pay | Admitting: Internal Medicine

## 2017-05-18 ENCOUNTER — Ambulatory Visit (HOSPITAL_COMMUNITY)
Admission: EM | Admit: 2017-05-18 | Discharge: 2017-05-18 | Disposition: A | Discharge status: Home / Self Care | Payer: Self-pay | Attending: Family Medicine | Admitting: Family Medicine

## 2017-05-18 ENCOUNTER — Encounter (HOSPITAL_COMMUNITY): Payer: Self-pay | Admitting: Family Medicine

## 2017-05-18 DIAGNOSIS — R197 Diarrhea, unspecified: Secondary | ICD-10-CM

## 2017-05-18 DIAGNOSIS — E119 Type 2 diabetes mellitus without complications: Secondary | ICD-10-CM

## 2017-05-18 DIAGNOSIS — M791 Myalgia, unspecified site: Secondary | ICD-10-CM

## 2017-05-18 DIAGNOSIS — R112 Nausea with vomiting, unspecified: Secondary | ICD-10-CM

## 2017-05-18 DIAGNOSIS — M25531 Pain in right wrist: Secondary | ICD-10-CM

## 2017-05-18 LAB — POCT URINALYSIS DIP (DEVICE)
BILIRUBIN URINE: NEGATIVE
Glucose, UA: NEGATIVE mg/dL
HGB URINE DIPSTICK: NEGATIVE
Ketones, ur: NEGATIVE mg/dL
LEUKOCYTES UA: NEGATIVE
NITRITE: NEGATIVE
Protein, ur: NEGATIVE mg/dL
Specific Gravity, Urine: 1.02 (ref 1.005–1.030)
Urobilinogen, UA: 1 mg/dL (ref 0.0–1.0)
pH: 7 (ref 5.0–8.0)

## 2017-05-18 LAB — GLUCOSE, CAPILLARY: GLUCOSE-CAPILLARY: 94 mg/dL (ref 65–99)

## 2017-05-18 MED ORDER — ONDANSETRON 4 MG PO TBDP: 4.0000 mg | ORAL_TABLET | Freq: Three times a day (TID) | ORAL | 0 refills | Status: DC | PRN

## 2017-05-18 MED ORDER — IBUPROFEN 600 MG PO TABS: 600.0000 mg | ORAL_TABLET | Freq: Four times a day (QID) | ORAL | 0 refills | Status: DC | PRN

## 2017-05-18 NOTE — Discharge Instructions (Addendum)
Your nausea, vomiting, and diarrhea appear to have a viral cause. Your symptoms should improve over the next week as your body continues to rid the infectious cause.  For nausea: Zofran prescribed. Begin with every 6 hours, than as you are able to hold food down, take it as needed. Start with clear liquids, then move to plain foods like bananas, rice, applesauce, toast, broth, grits, oatmeal. As those food settle okay you may transition to your normal foods. Avoid spicy and greasy foods as much as possible.  For Diarrhea: This is your body's natural way of getting rid of a virus. You may try taking 1 imodium to decrease amount of stools a day, but we do not want you to stop your diarrhea.   Preventing dehydration is key! You need to replace the fluid your body is expelling. Drink plenty of fluids, may use Pedialyte or sports drinks.   Please return if you are experiencing blood in your vomit or stool or experiencing dizziness, lightheadedness, extreme fatigue, increased abdominal pain.   Wrist:  Use anti-inflammatories for pain/swelling. You may take up to 800 mg Ibuprofen every 8 hours with food. You may supplement Ibuprofen with Tylenol [phone] mg every 8 hours.   Please use wrist brace for support over the next 2 weeks.

## 2017-05-18 NOTE — ED Triage Notes (Signed)
Pt here for chills, body aches and diarrhea since Sunday. sts also right wrist pain and trouble griping things, denies injury.

## 2017-05-18 NOTE — ED Provider Notes (Signed)
[facility]    CSN: [id number] Arrival date & time: 05/18/17  1431     History   Chief Complaint Chief Complaint  Patient presents with  . Generalized Body Aches  . Nausea  . Diarrhea    HPI Matthew Montgomery is a 57 y.o. male history of diabetes mellitus type 2 on insulin, GERD presenting today with chills, body aches, diarrhea, nausea and vomiting.  Symptoms began 3 days ago on Sunday.  He has had a total of 3 episodes of diarrhea.  2 episodes of vomiting.  He has felt his stomach being upset and nauseous.  Decreased appetite.  Denies any blood in the vomit or stool.  Denies abdominal pain.  Is having significant hot and cold chills.  States that last time he felt like this he had pneumonia.  Patient states he has a minimal cough but it is more related to allergies.  Denies sore throat and congestion.  Denies fevers.  Has not taken any medicines.  Patient takes 1000 units of Lantus insulin daily in the morning.  Does not check blood sugars at home.  Patient smokes 3-4 cigarettes a day.  Also noting recent wrist pain.  Has worsened in the past 3-4 days..  Denies any injury.  Worsens with gripping motions as well as flexion.  Denies numbness or tingling. HPI  Past Medical History:  Diagnosis Date  . 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 Decreased visual acuity 10/01/2006  . Diabetes mellitus without complication ([place]) 05/4626     Patient Active Problem List   Diagnosis Date Noted  . Pain of molar 06/26/2016  . Heme positive stool 08/09/2015  . Dry skin dermatitis 08/09/2015  . Tinea pedis, right 08/09/2015  . Diabetes mellitus type 2, controlled ([place]) 03/08/2014  . GERD (gastroesophageal reflux disease) 03/08/2014  . Light smoker 03/08/2014    History reviewed. No pertinent surgical history.     Home Medications    Prior to Admission medications   Medication Sig Start Date End Date Taking? Authorizing Provider  acetaminophen (TYLENOL) 325 MG tablet Take 2 tablets (650  mg total) by mouth every 6 (six) hours as needed. 08/30/15   Funches, Adriana Mccallum, MD  Blood Glucose Monitoring Suppl (TRUERESULT BLOOD GLUCOSE) W/DEVICE KIT 1 each by Does not apply route 3 (three) times daily before meals. 03/08/14   Funches, Adriana Mccallum, MD  cyclobenzaprine (FLEXERIL) 5 MG tablet Take 1 tablet (5 mg total) by mouth 3 (three) times daily as needed for muscle spasms. 06/13/15   Billy Fischer, MD  glucose blood (TRUETEST TEST) test strip 1 each by Other route 3 (three) times daily. Use as instructed 03/08/14   Boykin Nearing, MD  ibuprofen (ADVIL,MOTRIN) 600 MG tablet Take 1 tablet (600 mg total) by mouth every 6 (six) hours as needed. 05/18/17   Analayah Brooke C, PA-C  Insulin Glargine (LANTUS) 100 UNIT/ML Solostar Pen Inject 10 Units into the skin daily before breakfast. 06/26/16   Funches, Josalyn, MD  Insulin Pen Needle (B-D ULTRAFINE III SHORT PEN) 31G X 8 MM MISC 1 application by Does not apply route daily. 06/26/16   Funches, Adriana Mccallum, MD  ondansetron (ZOFRAN ODT) 4 MG disintegrating tablet Take 1 tablet (4 mg total) by mouth every 8 (eight) hours as needed for nausea or vomiting. 05/18/17   Sabri Teal C, PA-C  sitaGLIPtin (JANUVIA) 100 MG tablet Take 1 tablet (100 mg total) by mouth daily. 06/26/16   Funches, Adriana Mccallum, MD  terbinafine (LAMISIL AT) 1 % cream Apply 1  application topically 2 (two) times daily. 08/09/15   Funches, Adriana Mccallum, MD  TRUEPLUS INSULIN SYRINGE 30G X 5/16" 0.5 ML MISC USE WITH INSULIN 2 TIMES A DAY BEFORE LUNCH AND SUPPER 08/14/15   Funches, Adriana Mccallum, MD  TRUEPLUS LANCETS 28G MISC 1 each by Does not apply route 3 (three) times daily. 03/08/14   Boykin Nearing, MD    Family History Family History  Problem Relation Age of Onset  . Hypertension Mother   . Diabetes Mother   . Diabetes Father   . Cancer Father     Social History Social History   Tobacco Use  . Smoking status: Current Every Day Smoker    Packs/day: 0.50    Types: Cigarettes  . Smokeless tobacco:  Never Used  Substance Use Topics  . Alcohol use: Yes    Comment: socially  . Drug use: Yes    Frequency: 2.0 times per week    Types: Marijuana     Allergies   Patient has no known allergies.   Review of Systems Review of Systems  Constitutional: Positive for appetite change, chills and fatigue. Negative for activity change and fever.  HENT: Negative for congestion, ear pain, postnasal drip, rhinorrhea, sinus pressure and sore throat.   Eyes: Negative for pain and itching.  Respiratory: Positive for cough. Negative for shortness of breath.   Cardiovascular: Negative for chest pain.  Gastrointestinal: Positive for diarrhea, nausea and vomiting. Negative for abdominal pain.  Musculoskeletal: Positive for arthralgias and myalgias.  Skin: Negative for rash.  Neurological: Negative for dizziness, light-headedness and headaches.     Physical Exam Triage Vital Signs ED Triage Vitals  Enc Vitals Group     BP 05/18/17 1505 122/66     Pulse Rate 05/18/17 1505 66     Resp 05/18/17 1505 18     Temp 05/18/17 1505 98.6 F (37 C)     Temp Source 05/18/17 1505 Oral     SpO2 05/18/17 1505 100 %     Weight --      Height --      Head Circumference --      Peak Flow --      Pain Score 05/18/17 1504 9     Pain Loc --      Pain Edu? --      Excl. in [place]? --    No data found.  Updated Vital Signs BP 122/66   Pulse 66   Temp 98.6 F (37 C) (Oral)   Resp 18   SpO2 100%   Visual Acuity Right Eye Distance:   Left Eye Distance:   Bilateral Distance:    Right Eye Near:   Left Eye Near:    Bilateral Near:     Physical Exam  Constitutional: He appears well-developed and well-nourished.  HENT:  Head: Normocephalic and atraumatic.  Mouth/Throat: Oropharynx is clear and moist.  Eyes: Conjunctivae are normal.  Neck: Neck supple.  Cardiovascular: Normal rate and regular rhythm.  No murmur heard. Pulmonary/Chest: Effort normal and breath sounds normal. No respiratory distress.    Breathing comfortably at rest, CTA BL  Abdominal: Soft. There is tenderness.  Bowel sounds present throughout all 4 quadrants, tenderness to palpation of epigastrium, negative rebound, negative Murphy's.  Musculoskeletal: He exhibits no edema.  Right wrist: No obvious deformity swelling or erythema.  Mild tenderness to palpation of proximal metacarpals.  Pain with flexion and extension of wrist.  Neurological: He is alert.  Skin: Skin is warm and dry.  Psychiatric:  He has a normal mood and affect.  Nursing note and vitals reviewed.    UC Treatments / Results  Labs (all labs ordered are listed, but only abnormal results are displayed) Labs Reviewed  GLUCOSE, CAPILLARY  POCT URINALYSIS DIP (DEVICE)    EKG None Radiology No results found.  Procedures Procedures (including critical care time)  Medications Ordered in UC Medications - No data to display   Initial Impression / Assessment and Plan / UC Course  I have reviewed the triage vital signs and the nursing notes.  Pertinent labs & imaging results that were available during my care of the patient were reviewed by me and considered in my medical decision making (see chart for details).     Patient likely with viral gastroenteritis.  Do not feel chest x-ray is warranted at this time as patient with minimal cough, no fever, no shortness of breath or chest discomfort.  UA negative for glucose and ketones, blood sugar 94.  Will do supportive treatment.  Zofran for nausea and vomiting.  Discussed diet recommendations.  Advised to push fluids.  Having infrequent diarrhea episodes, advised to continue to monitor, expect gradual self resolution.  Does not appear dehydrated at this time.  Also with likely wrist sprain versus overuse injury.  Will provide wrist brace for support.  Anti-inflammatories and ice.  Discussed strict return precautions. Patient verbalized understanding and is agreeable with plan.   Final Clinical  Impressions(s) / UC Diagnoses   Final diagnoses:  Nausea vomiting and diarrhea    ED Discharge Orders        Ordered    ondansetron (ZOFRAN ODT) 4 MG disintegrating tablet  Every 8 hours PRN     05/18/17 1608    ibuprofen (ADVIL,MOTRIN) 600 MG tablet  Every 6 hours PRN     05/18/17 1608       Controlled Substance Prescriptions  Controlled Substance Registry consulted? Not Applicable   Janith Lima, [place] 05/18/17 2140

## 2017-08-27 ENCOUNTER — Ambulatory Visit: Payer: Self-pay | Admitting: Nurse Practitioner

## 2017-12-21 ENCOUNTER — Encounter (HOSPITAL_COMMUNITY): Payer: Self-pay | Admitting: Emergency Medicine

## 2017-12-21 ENCOUNTER — Ambulatory Visit (HOSPITAL_COMMUNITY)
Admission: EM | Admit: 2017-12-21 | Discharge: 2017-12-21 | Disposition: A | Discharge status: Home / Self Care | Payer: Self-pay | Attending: Family Medicine | Admitting: Family Medicine

## 2017-12-21 DIAGNOSIS — M5442 Lumbago with sciatica, left side: Secondary | ICD-10-CM

## 2017-12-21 MED ORDER — CYCLOBENZAPRINE HCL 10 MG PO TABS: 10.0000 mg | ORAL_TABLET | Freq: Two times a day (BID) | ORAL | 0 refills | Status: DC | PRN

## 2017-12-21 MED ORDER — KETOROLAC TROMETHAMINE 60 MG/2ML IM SOLN
INTRAMUSCULAR | Status: AC
  Filled 2017-12-21: qty 2

## 2017-12-21 MED ORDER — PREDNISONE 50 MG PO TABS: 50.0000 mg | ORAL_TABLET | Freq: Every day | ORAL | 0 refills | Status: AC

## 2017-12-21 MED ORDER — KETOROLAC TROMETHAMINE 60 MG/2ML IM SOLN
60.0000 mg | Freq: Once | INTRAMUSCULAR | Status: AC
  Administered 2017-12-21: 60 mg via INTRAMUSCULAR

## 2017-12-21 NOTE — ED Triage Notes (Signed)
Pt here for lower back pain with radiation to left leg 

## 2017-12-21 NOTE — Discharge Instructions (Signed)
We gave you a shot of Toradol today to help with your pain, this should kick in approximately 30 to 40 minutes Please begin prednisone daily for the next 5 days with food on your stomach Please use Flexeril up to twice daily, this is a muscle relaxer, this may cause drowsiness, please do not use while working, limit use to at home or bedtime.  Do not drive after taking. Alternate ice and heat on lower back Please go about daily activities, do not rest in bed all day; but avoid heavy lifting  Please follow-up if developing worsening pain, weakness in legs, numbness or tingling, numbness between thighs, loss of bowel or bladder control

## 2017-12-22 NOTE — ED Provider Notes (Signed)
[place]    CSN: [id number] Arrival date & time: 12/21/17  0908     History   Chief Complaint Chief Complaint  Patient presents with  . Back Pain    HPI Matthew Montgomery is a 57 y.o. male history of DM type II presenting today for evaluation of left-sided lower back pain.  Patient states that this weekend he helped move a heavy dresser.  Since he has had worsening left lower back pain.  Pain radiates into his left leg.  Denies radiation of numbness or tingling.  Denies saddle anesthesia.  Denies change in bowel or bladder habits and control.  States that he tried a Flexeril which he used from a previous visit without relief.  States that this feels similar to when he is previously strained his back.  States that this typically happens after heavy lifting.  Denies weakness in legs.  HPI  Past Medical History:  Diagnosis Date  . 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 Decreased visual acuity 10/01/2006  . Diabetes mellitus without complication ([place]) 08/6281     Patient Active Problem List   Diagnosis Date Noted  . Pain of molar 06/26/2016  . Heme positive stool 08/09/2015  . Dry skin dermatitis 08/09/2015  . Tinea pedis, right 08/09/2015  . Diabetes mellitus type 2, controlled ([place]) 03/08/2014  . GERD (gastroesophageal reflux disease) 03/08/2014  . Light smoker 03/08/2014    History reviewed. No pertinent surgical history.     Home Medications    Prior to Admission medications   Medication Sig Start Date End Date Taking? Authorizing Provider  acetaminophen (TYLENOL) 325 MG tablet Take 2 tablets (650 mg total) by mouth every 6 (six) hours as needed. 08/30/15   Funches, Adriana Mccallum, MD  Blood Glucose Monitoring Suppl (TRUERESULT BLOOD GLUCOSE) W/DEVICE KIT 1 each by Does not apply route 3 (three) times daily before meals. 03/08/14   Funches, Adriana Mccallum, MD  cyclobenzaprine (FLEXERIL) 10 MG tablet Take 1 tablet (10 mg total) by mouth 2 (two) times daily as needed for muscle spasms.  12/21/17   Anhelica Fowers C, PA-C  glucose blood (TRUETEST TEST) test strip 1 each by Other route 3 (three) times daily. Use as instructed 03/08/14   Boykin Nearing, MD  ibuprofen (ADVIL,MOTRIN) 600 MG tablet Take 1 tablet (600 mg total) by mouth every 6 (six) hours as needed. 05/18/17   Cashus Halterman C, PA-C  Insulin Glargine (LANTUS) 100 UNIT/ML Solostar Pen Inject 10 Units into the skin daily before breakfast. 06/26/16   Funches, Josalyn, MD  Insulin Pen Needle (B-D ULTRAFINE III SHORT PEN) 31G X 8 MM MISC 1 application by Does not apply route daily. 06/26/16   Funches, Adriana Mccallum, MD  ondansetron (ZOFRAN ODT) 4 MG disintegrating tablet Take 1 tablet (4 mg total) by mouth every 8 (eight) hours as needed for nausea or vomiting. 05/18/17   Gift Rueckert C, PA-C  predniSONE (DELTASONE) 50 MG tablet Take 1 tablet (50 mg total) by mouth daily for 5 days. 12/21/17 12/26/17  Tranesha Lessner C, PA-C  sitaGLIPtin (JANUVIA) 100 MG tablet Take 1 tablet (100 mg total) by mouth daily. 06/26/16   Funches, Adriana Mccallum, MD  terbinafine (LAMISIL AT) 1 % cream Apply 1 application topically 2 (two) times daily. 08/09/15   Funches, Adriana Mccallum, MD  TRUEPLUS INSULIN SYRINGE 30G X 5/16" 0.5 ML MISC USE WITH INSULIN 2 TIMES A DAY BEFORE LUNCH AND SUPPER 08/14/15   Boykin Nearing, MD  TRUEPLUS LANCETS 28G MISC 1 each by Does not apply route  3 (three) times daily. 03/08/14   Boykin Nearing, MD    Family History Family History  Problem Relation Age of Onset  . Hypertension Mother   . Diabetes Mother   . Diabetes Father   . Cancer Father     Social History Social History   Tobacco Use  . Smoking status: Current Every Day Smoker    Packs/day: 0.50    Types: Cigarettes  . Smokeless tobacco: Never Used  Substance Use Topics  . Alcohol use: Yes    Comment: socially  . Drug use: Yes    Frequency: 2.0 times per week    Types: Marijuana     Allergies   Patient has no known allergies.   Review of Systems Review of  Systems  Constitutional: Negative for fatigue and fever.  Eyes: Negative for redness, itching and visual disturbance.  Respiratory: Negative for shortness of breath.   Cardiovascular: Negative for chest pain and leg swelling.  Gastrointestinal: Negative for nausea and vomiting.  Genitourinary: Negative for decreased urine volume and difficulty urinating.  Musculoskeletal: Positive for back pain and myalgias. Negative for arthralgias.  Skin: Negative for color change, rash and wound.  Neurological: Negative for dizziness, syncope, weakness, light-headedness, numbness and headaches.     Physical Exam Triage Vital Signs ED Triage Vitals  Enc Vitals Group     BP 12/21/17 1035 131/72     Pulse Rate 12/21/17 1035 61     Resp 12/21/17 1035 18     Temp 12/21/17 1035 97.9 F (36.6 C)     Temp Source 12/21/17 1035 Oral     SpO2 12/21/17 1035 100 %     Weight --      Height --      Head Circumference --      Peak Flow --      Pain Score 12/21/17 1040 9     Pain Loc --      Pain Edu? --      Excl. in [place]? --    No data found.  Updated Vital Signs BP 131/72 (BP Location: Right Arm)   Pulse 61   Temp 97.9 F (36.6 C) (Oral)   Resp 18   SpO2 100%   Visual Acuity Right Eye Distance:   Left Eye Distance:   Bilateral Distance:    Right Eye Near:   Left Eye Near:    Bilateral Near:     Physical Exam  Constitutional: He appears well-developed and well-nourished.  HENT:  Head: Normocephalic and atraumatic.  Eyes: Conjunctivae are normal.  Neck: Neck supple.  Cardiovascular: Normal rate and regular rhythm.  No murmur heard. Pulmonary/Chest: Effort normal and breath sounds normal. No respiratory distress.  Abdominal: Soft. There is no tenderness.  Musculoskeletal: He exhibits no edema.  Mild tenderness to palpation of lumbar spine midline, increased tenderness to lateral lumbar musculature extending into glutes area, positive straight leg raise on left  Strength 5/5 and equal  bilaterally at hips and knees, patellar reflex 2+.  Gait with minimal abnormality  Neurological: He is alert.  Skin: Skin is warm and dry.  Psychiatric: He has a normal mood and affect.  Nursing note and vitals reviewed.    UC Treatments / Results  Labs (all labs ordered are listed, but only abnormal results are displayed) Labs Reviewed - No data to display  EKG None  Radiology No results found.  Procedures Procedures (including critical care time)  Medications Ordered in UC Medications  ketorolac (TORADOL) injection 60  mg (60 mg Intramuscular Given 12/21/17 1101)    Initial Impression / Assessment and Plan / UC Course  I have reviewed the triage vital signs and the nursing notes.  Pertinent labs & imaging results that were available during my care of the patient were reviewed by me and considered in my medical decision making (see chart for details).     Patient with left low back pain with radicular distribution.  Flexeril he had been using was approximately 55 years old.  Will provide Toradol prior to discharge.  Prednisone daily for the next 5 days.  Advised that this may increase his blood sugars and monitor sugars if increasing significantly to contact his PCP.  Provided new prescription of Flexeril, discussed sedation regarding this and advised him to only use at bedtime or when at home, do not drive or work with using.  No red flags for cauda equina.  Continue to monitor symptoms,Discussed strict return precautions. Patient verbalized understanding and is agreeable with plan.  Final Clinical Impressions(s) / UC Diagnoses   Final diagnoses:  Acute left-sided low back pain with left-sided sciatica     Discharge Instructions     We gave you a shot of Toradol today to help with your pain, this should kick in approximately 30 to 40 minutes Please begin prednisone daily for the next 5 days with food on your stomach Please use Flexeril up to twice daily, this is a  muscle relaxer, this may cause drowsiness, please do not use while working, limit use to at home or bedtime.  Do not drive after taking. Alternate ice and heat on lower back Please go about daily activities, do not rest in bed all day; but avoid heavy lifting  Please follow-up if developing worsening pain, weakness in legs, numbness or tingling, numbness between thighs, loss of bowel or bladder control   ED Prescriptions    Medication Sig Dispense Auth. Provider   predniSONE (DELTASONE) 50 MG tablet Take 1 tablet (50 mg total) by mouth daily for 5 days. 5 tablet Deandre Brannan C, PA-C   cyclobenzaprine (FLEXERIL) 10 MG tablet Take 1 tablet (10 mg total) by mouth 2 (two) times daily as needed for muscle spasms. 20 tablet Shalik Sanfilippo, Bennett C, PA-C     Controlled Substance Prescriptions [place] Controlled Substance Registry consulted? Not Applicable   Janith Lima, [place] 12/22/17 1135

## 2018-08-04 ENCOUNTER — Emergency Department (HOSPITAL_COMMUNITY)
Admission: EM | Admit: 2018-08-04 | Discharge: 2018-08-05 | Disposition: A | Discharge status: Home / Self Care | Payer: Self-pay | Attending: Emergency Medicine | Admitting: Emergency Medicine

## 2018-08-04 DIAGNOSIS — F1910 Other psychoactive substance abuse, uncomplicated: Secondary | ICD-10-CM | POA: Insufficient documentation

## 2018-08-04 DIAGNOSIS — T50901A Poisoning by unspecified drugs, medicaments and biological substances, accidental (unintentional), initial encounter: Secondary | ICD-10-CM | POA: Insufficient documentation

## 2018-08-04 DIAGNOSIS — E876 Hypokalemia: Secondary | ICD-10-CM | POA: Insufficient documentation

## 2018-08-04 DIAGNOSIS — F17210 Nicotine dependence, cigarettes, uncomplicated: Secondary | ICD-10-CM | POA: Insufficient documentation

## 2018-08-04 DIAGNOSIS — E119 Type 2 diabetes mellitus without complications: Secondary | ICD-10-CM | POA: Insufficient documentation

## 2018-08-04 DIAGNOSIS — Z79899 Other long term (current) drug therapy: Secondary | ICD-10-CM | POA: Insufficient documentation

## 2018-08-04 DIAGNOSIS — Z794 Long term (current) use of insulin: Secondary | ICD-10-CM | POA: Insufficient documentation

## 2018-08-04 MED ORDER — NALOXONE HCL 0.4 MG/ML IJ SOLN
0.4000 mg | Freq: Once | INTRAMUSCULAR | Status: AC
  Administered 2018-08-05: 0.4 mg via INTRAVENOUS

## 2018-08-04 MED ORDER — NALOXONE HCL 0.4 MG/ML IJ SOLN
INTRAMUSCULAR | Status: AC
  Administered 2018-08-05: 0.4 mg via INTRAVENOUS
  Filled 2018-08-04: qty 1

## 2018-08-04 NOTE — ED Triage Notes (Signed)
Pt came in by POV. Pt reports drinking alcohol, smoking marijuana, and snorting 2-3 lines of cocaine tonight. Pt denies using narcotics. Pt denies any pain. Pt lethargic, unable to stand, reports weakness.

## 2018-08-04 NOTE — ED Notes (Signed)
Pts brother PENG THORSTENSON [phone])

## 2018-08-05 ENCOUNTER — Encounter (HOSPITAL_COMMUNITY): Payer: Self-pay | Admitting: Emergency Medicine

## 2018-08-05 ENCOUNTER — Emergency Department (HOSPITAL_COMMUNITY): Payer: Self-pay

## 2018-08-05 ENCOUNTER — Other Ambulatory Visit: Payer: Self-pay

## 2018-08-05 LAB — COMPREHENSIVE METABOLIC PANEL
ALT: 21 U/L (ref 0–44)
AST: 23 U/L (ref 15–41)
Albumin: 4.4 g/dL (ref 3.5–5.0)
Alkaline Phosphatase: 67 U/L (ref 38–126)
Anion gap: 10 (ref 5–15)
BUN: 11 mg/dL (ref 6–20)
CO2: 21 mmol/L — ABNORMAL LOW (ref 22–32)
Calcium: 9.2 mg/dL (ref 8.9–10.3)
Chloride: 107 mmol/L (ref 98–111)
Creatinine, Ser: 1.2 mg/dL (ref 0.61–1.24)
GFR calc Af Amer: >60 mL/min (ref >60)
GFR calc non Af Amer: >60 mL/min (ref >60)
Glucose, Bld: 154 mg/dL — ABNORMAL HIGH (ref 70–99)
Potassium: 3.1 mmol/L — ABNORMAL LOW (ref 3.5–5.1)
Sodium: 138 mmol/L (ref 135–145)
Total Bilirubin: 0.5 mg/dL (ref 0.3–1.2)
Total Protein: 7.3 g/dL (ref 6.5–8.1)

## 2018-08-05 LAB — RAPID URINE DRUG SCREEN, HOSP PERFORMED
Amphetamines: NOT DETECTED
Barbiturates: NOT DETECTED
Benzodiazepines: NOT DETECTED
Cocaine: POSITIVE — AB
Opiates: NOT DETECTED
Tetrahydrocannabinol: POSITIVE — AB

## 2018-08-05 LAB — ETHANOL: Alcohol, Ethyl (B): 65 mg/dL — ABNORMAL HIGH (ref <10)

## 2018-08-05 LAB — ACETAMINOPHEN LEVEL: Acetaminophen (Tylenol), Serum: <10 ug/mL — ABNORMAL LOW (ref 10–30)

## 2018-08-05 LAB — CBG MONITORING, ED: Glucose-Capillary: 143 mg/dL — ABNORMAL HIGH (ref 70–99)

## 2018-08-05 LAB — SALICYLATE LEVEL: Salicylate Lvl: <7 mg/dL (ref 2.8–30.0)

## 2018-08-05 MED ORDER — POTASSIUM CHLORIDE CRYS ER 20 MEQ PO TBCR
40.0000 meq | EXTENDED_RELEASE_TABLET | Freq: Once | ORAL | Status: AC
  Administered 2018-08-05: 40 meq via ORAL
  Filled 2018-08-05: qty 2

## 2018-08-05 NOTE — ED Notes (Signed)
Pt's brother Jiles Prows, [phone]

## 2018-08-05 NOTE — Discharge Instructions (Addendum)
You apparently had an accidental overdose of some drug that was mixed with your cocaine. Please do not use street drugs - you are never sure what you are actually getting.

## 2018-08-05 NOTE — ED Provider Notes (Signed)
[facility] EMERGENCY DEPARTMENT Provider Note   CSN: [id number] Arrival date & time: 08/04/18  2355     History   Chief Complaint Chief Complaint  Patient presents with  . unresponsive    HPI Matthew Montgomery is a 58 y.o. male.   The history is provided by the patient.  He has history of diabetes, GERD and comes in because of lethargy.  He states that he had a couple of drinks, had snorted some cocaine and had smoked some marijuana.  He states he feels that it is a little difficult to breathe.  He denies IV drug use.  He denies any pain anywhere.  Past Medical History:  Diagnosis Date  . 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 Decreased visual acuity 10/01/2006  . Diabetes mellitus without complication ([place]) 07/1441     Patient Active Problem List   Diagnosis Date Noted  . Pain of molar 06/26/2016  . Heme positive stool 08/09/2015  . Dry skin dermatitis 08/09/2015  . Tinea pedis, right 08/09/2015  . Diabetes mellitus type 2, controlled ([place]) 03/08/2014  . GERD (gastroesophageal reflux disease) 03/08/2014  . Light smoker 03/08/2014    No past surgical history on file.      Home Medications    Prior to Admission medications   Medication Sig Start Date End Date Taking? Authorizing Provider  acetaminophen (TYLENOL) 325 MG tablet Take 2 tablets (650 mg total) by mouth every 6 (six) hours as needed. 08/30/15   Funches, Adriana Mccallum, MD  Blood Glucose Monitoring Suppl (TRUERESULT BLOOD GLUCOSE) W/DEVICE KIT 1 each by Does not apply route 3 (three) times daily before meals. 03/08/14   Funches, Adriana Mccallum, MD  cyclobenzaprine (FLEXERIL) 10 MG tablet Take 1 tablet (10 mg total) by mouth 2 (two) times daily as needed for muscle spasms. 12/21/17   Wieters, Hallie C, PA-C  glucose blood (TRUETEST TEST) test strip 1 each by Other route 3 (three) times daily. Use as instructed 03/08/14   Boykin Nearing, MD  ibuprofen (ADVIL,MOTRIN) 600 MG tablet Take 1 tablet (600 mg total) by  mouth every 6 (six) hours as needed. 05/18/17   Wieters, Hallie C, PA-C  Insulin Glargine (LANTUS) 100 UNIT/ML Solostar Pen Inject 10 Units into the skin daily before breakfast. 06/26/16   Funches, Josalyn, MD  Insulin Pen Needle (B-D ULTRAFINE III SHORT PEN) 31G X 8 MM MISC 1 application by Does not apply route daily. 06/26/16   Funches, Adriana Mccallum, MD  ondansetron (ZOFRAN ODT) 4 MG disintegrating tablet Take 1 tablet (4 mg total) by mouth every 8 (eight) hours as needed for nausea or vomiting. 05/18/17   Wieters, Hallie C, PA-C  sitaGLIPtin (JANUVIA) 100 MG tablet Take 1 tablet (100 mg total) by mouth daily. 06/26/16   Funches, Adriana Mccallum, MD  terbinafine (LAMISIL AT) 1 % cream Apply 1 application topically 2 (two) times daily. 08/09/15   Funches, Adriana Mccallum, MD  TRUEPLUS INSULIN SYRINGE 30G X 5/16" 0.5 ML MISC USE WITH INSULIN 2 TIMES A DAY BEFORE LUNCH AND SUPPER 08/14/15   Funches, Adriana Mccallum, MD  TRUEPLUS LANCETS 28G MISC 1 each by Does not apply route 3 (three) times daily. 03/08/14   Boykin Nearing, MD    Family History Family History  Problem Relation Age of Onset  . Hypertension Mother   . Diabetes Mother   . Diabetes Father   . Cancer Father     Social History Social History   Tobacco Use  . Smoking status: Current Every Day Smoker  Packs/day: 0.50    Types: Cigarettes  . Smokeless tobacco: Never Used  Substance Use Topics  . Alcohol use: Yes    Comment: socially  . Drug use: Yes    Frequency: 2.0 times per week    Types: Marijuana     Allergies   Patient has no known allergies.   Review of Systems Review of Systems  All other systems reviewed and are negative.    Physical Exam Updated Vital Signs BP 106/78   Pulse 63   Resp 20   SpO2 99%   Physical Exam Vitals signs and nursing note reviewed.    58 year old male, resting comfortably and in no acute distress. Vital signs are normal. Oxygen saturation is 99%, which is normal. Head is normocephalic and atraumatic.  PERRLA, EOMI. Oropharynx is clear. Neck is nontender and supple without adenopathy or JVD. Back is nontender and there is no CVA tenderness. Lungs are clear without rales, wheezes, or rhonchi. Chest is nontender. Heart has regular rate and rhythm without murmur. Abdomen is soft, flat, nontender without masses or hepatosplenomegaly and peristalsis is normoactive. Extremities have no cyanosis or edema, full range of motion is present. Skin is warm and dry without rash. Neurologic: Drowsy but arousable, cranial nerves are intact, there are no motor or sensory deficits.  ED Treatments / Results  Labs (all labs ordered are listed, but only abnormal results are displayed) Labs Reviewed  ACETAMINOPHEN LEVEL - Abnormal; Notable for the following components:      Result Value   Acetaminophen (Tylenol), Serum <10 (*)    All other components within normal limits  COMPREHENSIVE METABOLIC PANEL - Abnormal; Notable for the following components:   Potassium 3.1 (*)    CO2 21 (*)    Glucose, Bld 154 (*)    All other components within normal limits  ETHANOL - Abnormal; Notable for the following components:   Alcohol, Ethyl (B) 65 (*)    All other components within normal limits  RAPID URINE DRUG SCREEN, HOSP PERFORMED - Abnormal; Notable for the following components:   Cocaine POSITIVE (*)    Tetrahydrocannabinol POSITIVE (*)    All other components within normal limits  CBG MONITORING, ED - Abnormal; Notable for the following components:   Glucose-Capillary 143 (*)    All other components within normal limits  SALICYLATE LEVEL    EKG EKG Interpretation  Date/Time:  Thursday August 04 2018 23:58:12 EDT Ventricular Rate:  69 PR Interval:    QRS Duration: 95 QT Interval:  403 QTC Calculation: 432 R Axis:   90 Text Interpretation:  Sinus rhythm Borderline prolonged PR interval Borderline right axis deviation No old tracing to compare Confirmed by Delora Fuel ([id number]) on 08/05/2018 12:04:55 AM    Radiology Dg Chest Port 1 View  Result Date: 08/05/2018 CLINICAL DATA:  Cough EXAM: PORTABLE CHEST 1 VIEW COMPARISON:  02/07/2016 FINDINGS: The heart size and mediastinal contours are within normal limits. Both lungs are clear. The visualized skeletal structures are unremarkable. IMPRESSION: No active disease. Electronically Signed   By: Constance Holster M.D.   On: 08/05/2018 00:16    Procedures Procedures  CRITICAL CARE Performed by: Delora Fuel Total critical care time: 40 minutes Critical care time was exclusive of separately billable procedures and treating other patients. Critical care was necessary to treat or prevent imminent or life-threatening deterioration. Critical care was time spent personally by me on the following activities: development of treatment plan with patient and/or surrogate as well as nursing,  discussions with consultants, evaluation of patient's response to treatment, examination of patient, obtaining history from patient or surrogate, ordering and performing treatments and interventions, ordering and review of laboratory studies, ordering and review of radiographic studies, pulse oximetry and re-evaluation of patient's condition.  Medications Ordered in ED Medications  naloxone (NARCAN) injection 0.4 mg (0.4 mg Intravenous Given 08/05/18 0003)  potassium chloride SA (K-DUR) CR tablet 40 mEq (40 mEq Oral Given 08/05/18 0158)     Initial Impression / Assessment and Plan / ED Course  I have reviewed the triage vital signs and the nursing notes.  Pertinent labs & imaging results that were available during my care of the patient were reviewed by me and considered in my medical decision making (see chart for details).  Lethargy following drug use.  He is maintaining his airway and maintaining adequate oxygenation on room air.  He is given a therapeutic trial of naloxone with no change.  Screening labs will be obtained and he will be observed closely.  Old records  are reviewed, and he has no relevant past visits.  ECG shows no acute changes.  2:06 AM Patient more awake and conversant.  Initial labs are unremarkable except for low potassium and is given a dose of oral potassium.  Drug screen is pending.  3:35 AM Drug screen is positive for cocaine and marijuana, negative for opiates.  However, that does not exclude opioid use.  He has been awake and alert and is stable for discharge.  He is advised not to use street drugs.  He is given outpatient rehab resources.  Final Clinical Impressions(s) / ED Diagnoses   Final diagnoses:  Accidental overdose, initial encounter  Polysubstance abuse [facility])  Hypokalemia    ED Discharge Orders    None       Delora Fuel, MD 19[birth year] [phone]

## 2019-01-09 ENCOUNTER — Encounter (HOSPITAL_COMMUNITY): Payer: Self-pay

## 2019-01-09 ENCOUNTER — Other Ambulatory Visit: Payer: Self-pay

## 2019-01-09 ENCOUNTER — Ambulatory Visit (HOSPITAL_COMMUNITY)
Admission: EM | Admit: 2019-01-09 | Discharge: 2019-01-09 | Disposition: A | Discharge status: Home / Self Care | Payer: Self-pay | Attending: Internal Medicine | Admitting: Internal Medicine

## 2019-01-09 DIAGNOSIS — N342 Other urethritis: Secondary | ICD-10-CM | POA: Insufficient documentation

## 2019-01-09 DIAGNOSIS — Z711 Person with feared health complaint in whom no diagnosis is made: Secondary | ICD-10-CM | POA: Insufficient documentation

## 2019-01-09 LAB — POCT URINALYSIS DIP (DEVICE)
Bilirubin Urine: NEGATIVE
Glucose, UA: NEGATIVE mg/dL
Ketones, ur: NEGATIVE mg/dL
Nitrite: NEGATIVE
Protein, ur: NEGATIVE mg/dL
Specific Gravity, Urine: 1.015 (ref 1.005–1.030)
Urobilinogen, UA: 0.2 mg/dL (ref 0.0–1.0)
pH: 7 (ref 5.0–8.0)

## 2019-01-09 MED ORDER — AZITHROMYCIN 250 MG PO TABS
1000.0000 mg | ORAL_TABLET | Freq: Once | ORAL | Status: AC
  Administered 2019-01-09: 1000 mg via ORAL

## 2019-01-09 MED ORDER — CEFTRIAXONE SODIUM 250 MG IJ SOLR
250.0000 mg | Freq: Once | INTRAMUSCULAR | Status: AC
  Administered 2019-01-09: 250 mg via INTRAMUSCULAR

## 2019-01-09 MED ORDER — AZITHROMYCIN 250 MG PO TABS
ORAL_TABLET | ORAL | Status: AC
  Filled 2019-01-09: qty 4

## 2019-01-09 MED ORDER — LIDOCAINE HCL (PF) 1 % IJ SOLN
INTRAMUSCULAR | Status: AC
  Filled 2019-01-09: qty 2

## 2019-01-09 MED ORDER — CEFTRIAXONE SODIUM 250 MG IJ SOLR
INTRAMUSCULAR | Status: AC
  Filled 2019-01-09: qty 250

## 2019-01-09 NOTE — Discharge Instructions (Addendum)
We have treated you today for gonorrhea and chlamydia.  °Will notify you of any positive findings and if any changes to treatment are needed.  You may monitor your results on your MyChart online as well.   °Please withhold from intercourse for the next week. °Please use condoms to prevent STD's.   °If symptoms worsen or do not improve in the next week to return to be seen or to follow up with your PCP.   °

## 2019-01-09 NOTE — ED Triage Notes (Signed)
Pt states having burning sensation when urinating x 3 days. Pt states having brown penile discharge x 1 day.

## 2019-01-09 NOTE — ED Provider Notes (Signed)
Matthew Montgomery    CSN: [id number] Arrival date & time: 01/09/19  4098      History   Chief Complaint Chief Complaint  Patient presents with  . Dysuria  . Penile Discharge    HPI Matthew Montgomery is a 58 y.o. male.   Matthew Montgomery presents with complaints of burning with urination as well as some brown penile discharge. He noticed these things yesterday. No urinary frequency. No pelvic or back pain. No fevers. Denies penile sores, lesions, redness, or swelling. Has had similar in the past but has been years now. He is concerned about stds. Sexually active  With 2 partners, doesn't use condoms. History of dm.    ROS per HPI, negative if not otherwise mentioned.      Past Medical History:  Diagnosis Date  . 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 Decreased visual acuity 10/01/2006  . Diabetes mellitus without complication ([place]) 02/1912     Patient Active Problem List   Diagnosis Date Noted  . Pain of molar 06/26/2016  . Heme positive stool 08/09/2015  . Dry skin dermatitis 08/09/2015  . Tinea pedis, right 08/09/2015  . Diabetes mellitus type 2, controlled ([place]) 03/08/2014  . GERD (gastroesophageal reflux disease) 03/08/2014  . Light smoker 03/08/2014    History reviewed. No pertinent surgical history.     Home Medications    Prior to Admission medications   Medication Sig Start Date End Date Taking? Authorizing Provider  acetaminophen (TYLENOL) 325 MG tablet Take 2 tablets (650 mg total) by mouth every 6 (six) hours as needed. 08/30/15   Funches, Adriana Mccallum, MD  Blood Glucose Monitoring Suppl (TRUERESULT BLOOD GLUCOSE) W/DEVICE KIT 1 each by Does not apply route 3 (three) times daily before meals. 03/08/14   Funches, Adriana Mccallum, MD  cyclobenzaprine (FLEXERIL) 10 MG tablet Take 1 tablet (10 mg total) by mouth 2 (two) times daily as needed for muscle spasms. 12/21/17   Wieters, Hallie C, PA-C  glucose blood (TRUETEST TEST) test strip 1 each by Other route 3  (three) times daily. Use as instructed 03/08/14   Boykin Nearing, MD  ibuprofen (ADVIL,MOTRIN) 600 MG tablet Take 1 tablet (600 mg total) by mouth every 6 (six) hours as needed. 05/18/17   Wieters, Hallie C, PA-C  Insulin Glargine (LANTUS) 100 UNIT/ML Solostar Pen Inject 10 Units into the skin daily before breakfast. 06/26/16   Funches, Josalyn, MD  Insulin Pen Needle (B-D ULTRAFINE III SHORT PEN) 31G X 8 MM MISC 1 application by Does not apply route daily. 06/26/16   Funches, Adriana Mccallum, MD  ondansetron (ZOFRAN ODT) 4 MG disintegrating tablet Take 1 tablet (4 mg total) by mouth every 8 (eight) hours as needed for nausea or vomiting. 05/18/17   Wieters, Hallie C, PA-C  sitaGLIPtin (JANUVIA) 100 MG tablet Take 1 tablet (100 mg total) by mouth daily. 06/26/16   Funches, Adriana Mccallum, MD  terbinafine (LAMISIL AT) 1 % cream Apply 1 application topically 2 (two) times daily. 08/09/15   Funches, Adriana Mccallum, MD  TRUEPLUS INSULIN SYRINGE 30G X 5/16" 0.5 ML MISC USE WITH INSULIN 2 TIMES A DAY BEFORE LUNCH AND SUPPER 08/14/15   Funches, Adriana Mccallum, MD  TRUEPLUS LANCETS 28G MISC 1 each by Does not apply route 3 (three) times daily. 03/08/14   Boykin Nearing, MD    Family History Family History  Problem Relation Age of Onset  . Hypertension Mother   . Diabetes Mother   . Diabetes Father   . Cancer Father     Social  History Social History   Tobacco Use  . Smoking status: Current Every Day Smoker    Packs/day: 0.50    Types: Cigarettes  . Smokeless tobacco: Never Used  Substance Use Topics  . Alcohol use: Yes    Comment: socially  . Drug use: Yes    Frequency: 2.0 times per week    Types: Marijuana, Cocaine     Allergies   Patient has no known allergies.   Review of Systems Review of Systems   Physical Exam Triage Vital Signs ED Triage Vitals  Enc Vitals Group     BP 01/09/19 1002 114/74     Pulse Rate 01/09/19 1002 73     Resp 01/09/19 1002 16     Temp 01/09/19 1002 98.1 F (36.7 C)     Temp  Source 01/09/19 1002 Oral     SpO2 01/09/19 1002 95 %     Weight --      Height --      Head Circumference --      Peak Flow --      Pain Score 01/09/19 1001 0     Pain Loc --      Pain Edu? --      Excl. in [place]? --    No data found.  Updated Vital Signs BP 114/74 (BP Location: Right Arm)   Pulse 73   Temp 98.1 F (36.7 C) (Oral)   Resp 16   SpO2 95%    Physical Exam Constitutional:      Appearance: He is well-developed.  Cardiovascular:     Rate and Rhythm: Normal rate.  Pulmonary:     Effort: Pulmonary effort is normal.  Abdominal:     Palpations: Abdomen is soft. Abdomen is not rigid.     Tenderness: There is no abdominal tenderness. There is no guarding or rebound. Negative signs include Murphy's sign and McBurney's sign.     Comments: Denies scrotal redness, swelling, pain; denies sores or lesions; gu exam deferred   Skin:    General: Skin is warm and dry.  Neurological:     Mental Status: He is alert and oriented to person, place, and time.      UC Treatments / Results  Labs (all labs ordered are listed, but only abnormal results are displayed) Labs Reviewed  POCT URINALYSIS DIP (DEVICE) - Abnormal; Notable for the following components:      Result Value   Hgb urine dipstick TRACE (*)    Leukocytes,Ua MODERATE (*)    All other components within normal limits  URINE CULTURE  CYTOLOGY, (ORAL, ANAL, URETHRAL) ANCILLARY ONLY    EKG   Radiology No results found.  Procedures Procedures (including critical care time)  Medications Ordered in UC Medications  azithromycin (ZITHROMAX) tablet 1,000 mg (1,000 mg Oral Given 01/09/19 1043)  cefTRIAXone (ROCEPHIN) injection 250 mg (250 mg Intramuscular Given 01/09/19 1043)  azithromycin (ZITHROMAX) 250 MG tablet (has no administration in time range)  cefTRIAXone (ROCEPHIN) 250 MG injection (has no administration in time range)  lidocaine (PF) (XYLOCAINE) 1 % injection (has no administration in time range)     Initial Impression / Assessment and Plan / UC Course  I have reviewed the triage vital signs and the nursing notes.  Pertinent labs & imaging results that were available during my care of the patient were reviewed by me and considered in my medical decision making (see chart for details).     Concern for stds with empiric treatment provided. Penile  cytology as well a urine culture pending. Safe sex encouraged. Patient verbalized understanding and agreeable to plan.   Final Clinical Impressions(s) / UC Diagnoses   Final diagnoses:  Urethritis  Concern about STD in male without diagnosis     Discharge Instructions     We have treated you today for gonorrhea and chlamydia.  Will notify you of any positive findings and if any changes to treatment are needed.  You may monitor your results on your MyChart online as well.   Please withhold from intercourse for the next week. Please use condoms to prevent STD's.   If symptoms worsen or do not improve in the next week to return to be seen or to follow up with your PCP.     ED Prescriptions    None     PDMP not reviewed this encounter.   Zigmund Gottron, NP 01/09/19 2027

## 2019-01-10 LAB — CYTOLOGY, (ORAL, ANAL, URETHRAL) ANCILLARY ONLY
Chlamydia: NEGATIVE
Neisseria Gonorrhea: POSITIVE — AB
Trichomonas: NEGATIVE

## 2019-01-10 LAB — URINE CULTURE: Culture: NO GROWTH

## 2019-01-11 ENCOUNTER — Telehealth (HOSPITAL_COMMUNITY): Payer: Self-pay | Admitting: Emergency Medicine

## 2019-01-11 NOTE — Telephone Encounter (Signed)
Test for gonorrhea was positive. This was treated at the urgent care visit with IM rocephin 250mg and po zithromax 1g. Pt needs education to refrain from sexual intercourse for 7 days after treatment to give the medicine time to work. Sexual partners need to be notified and tested/treated. Condoms may reduce risk of reinfection. Recheck or followup with PCP for further evaluation if symptoms are not improving. [facility] notified.   Attempted to reach patient. No answer at this time. Voicemail left.     

## 2019-01-13 ENCOUNTER — Telehealth: Payer: Self-pay | Admitting: Emergency Medicine

## 2019-01-13 NOTE — Telephone Encounter (Signed)
Attempted to reach patient x2. No answer at this time. Voicemail left.    

## 2019-01-16 ENCOUNTER — Telehealth: Payer: Self-pay | Admitting: Emergency Medicine

## 2019-01-16 NOTE — Telephone Encounter (Signed)
Patient contacted and made aware of  cytology  results. Pt verbalized understanding and had all questions answered.   

## 2019-03-27 ENCOUNTER — Ambulatory Visit: Payer: Self-pay | Attending: Nurse Practitioner | Admitting: Nurse Practitioner

## 2019-03-27 ENCOUNTER — Other Ambulatory Visit: Payer: Self-pay

## 2019-03-27 ENCOUNTER — Ambulatory Visit: Payer: Self-pay

## 2019-03-27 ENCOUNTER — Encounter: Payer: Self-pay | Admitting: Nurse Practitioner

## 2019-03-27 DIAGNOSIS — Z130 Encounter for screening for diseases of the blood and blood-forming organs and certain disorders involving the immune mechanism: Secondary | ICD-10-CM

## 2019-03-27 DIAGNOSIS — Z125 Encounter for screening for malignant neoplasm of prostate: Secondary | ICD-10-CM

## 2019-03-27 DIAGNOSIS — E785 Hyperlipidemia, unspecified: Secondary | ICD-10-CM

## 2019-03-27 DIAGNOSIS — Z1211 Encounter for screening for malignant neoplasm of colon: Secondary | ICD-10-CM

## 2019-03-27 DIAGNOSIS — F17210 Nicotine dependence, cigarettes, uncomplicated: Secondary | ICD-10-CM

## 2019-03-27 DIAGNOSIS — Z7689 Persons encountering health services in other specified circumstances: Secondary | ICD-10-CM

## 2019-03-27 DIAGNOSIS — F17200 Nicotine dependence, unspecified, uncomplicated: Secondary | ICD-10-CM

## 2019-03-27 DIAGNOSIS — E1165 Type 2 diabetes mellitus with hyperglycemia: Secondary | ICD-10-CM

## 2019-03-27 MED ORDER — TRUE METRIX METER W/DEVICE KIT: PACK | 0 refills | Status: DC

## 2019-03-27 MED ORDER — TRUE METRIX BLOOD GLUCOSE TEST VI STRP: ORAL_STRIP | 12 refills | Status: DC

## 2019-03-27 MED ORDER — TRUEPLUS LANCETS 28G MISC: 6 refills | Status: DC

## 2019-03-27 NOTE — Progress Notes (Signed)
Virtual Visit via Telephone Note Due to national recommendations of social distancing due to [place] 19, telehealth visit is felt to be most appropriate for this patient at this time.  I discussed the limitations, risks, security and privacy concerns of performing an evaluation and management service by telephone and the availability of in person appointments. I also discussed with the patient that there may be a patient responsible charge related to this service. The patient expressed understanding and agreed to proceed.    I connected with Matthew Montgomery on 03/27/19  at  10:30 AM EST  EDT by telephone and verified that I am speaking with the correct person using two identifiers.   Consent I discussed the limitations, risks, security and privacy concerns of performing an evaluation and management service by telephone and the availability of in person appointments. I also discussed with the patient that there may be a patient responsible charge related to this service. The patient expressed understanding and agreed to proceed.   Location of Patient: Private Residence   Location of Provider: [place] and [employer] Office    Persons participating in Telemedicine visit: Geryl Rankins FNP-BC [place]    History of Present Illness: Telemedicine visit for: Establish Care  has a past medical history of Astigmatism (10/01/2006), Decreased visual acuity (10/01/2006), and Diabetes mellitus without complication ([place]) (07/3891 ).  DM TYPE 2 Well controlled. He is not monitoring his blood glucose levels. Needs new meter and supplies. Has not been taking any medications in several years and has not been seen in this clinic since 2018. Endorses blurred vision and diaphoresis in the middle of the night but has no idea what his blood glucose levels are. Will start on metformin 500 mg daily. Past medications include januvia 100 mg, metformin 500 mg BID, lantus 10  units daily.  Lab Results  Component Value Date   HGBA1C 6.4 (H) 03/27/2019   Lab Results  Component Value Date   HGBA1C 6.1 06/26/2016      Past Medical History:  Diagnosis Date  . 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 Decreased visual acuity 10/01/2006  . Diabetes mellitus without complication ([place]) 08/3426     History reviewed. No pertinent surgical history.  Family History  Problem Relation Age of Onset  . Hypertension Mother   . Diabetes Mother   . Diabetes Father   . Cancer Father     Social History   Socioeconomic History  . Marital status: Single    Spouse name: Not on file  . Number of children: Not on file  . Years of education: Not on file  . Highest education level: Not on file  Occupational History  . Not on file  Tobacco Use  . Smoking status: Current Every Day Smoker    Packs/day: 0.50    Types: Cigarettes  . Smokeless tobacco: Never Used  Substance and Sexual Activity  . Alcohol use: Yes    Comment: socially  . Drug use: Yes    Frequency: 2.0 times per week    Types: Marijuana, Cocaine  . Sexual activity: Yes  Other Topics Concern  . Not on file  Social History Narrative  . Not on file   Social Determinants of Health   Financial Resource Strain:   . Difficulty of Paying Living Expenses: Not on file  Food Insecurity:   . Worried About Charity fundraiser in the Last Year: Not on file  . Ran Out of Food in the Last  Year: Not on file  Transportation Needs:   . Lack of Transportation (Medical): Not on file  . Lack of Transportation (Non-Medical): Not on file  Physical Activity:   . Days of Exercise per Week: Not on file  . Minutes of Exercise per Session: Not on file  Stress:   . Feeling of Stress : Not on file  Social Connections:   . Frequency of Communication with Friends and Family: Not on file  . Frequency of Social Gatherings with Friends and Family: Not on file  . Attends Religious Services: Not on file  . Active Member of Clubs or  Organizations: Not on file  . Attends Archivist Meetings: Not on file  . Marital Status: Not on file     Observations/Objective: Awake, alert and oriented x 3   Review of Systems  Constitutional: Positive for diaphoresis. Negative for fever, malaise/fatigue and weight loss.  HENT: Negative.  Negative for nosebleeds.   Eyes: Positive for blurred vision. Negative for double vision, photophobia, pain, discharge and redness.  Respiratory: Negative.  Negative for cough and shortness of breath.   Cardiovascular: Negative.  Negative for chest pain, palpitations and leg swelling.  Gastrointestinal: Negative.  Negative for heartburn, nausea and vomiting.  Musculoskeletal: Negative.  Negative for myalgias.  Neurological: Negative.  Negative for dizziness, focal weakness, seizures and headaches.  Psychiatric/Behavioral: Negative.  Negative for suicidal ideas.    Assessment and Plan: Raudel was seen today for establish care.  Diagnoses and all orders for this visit: Encounter to establish care  Controlled type 2 diabetes mellitus with hyperglycemia, without long-term current use of insulin ([place]) -     Blood Glucose Monitoring Suppl (TRUE METRIX METER) w/Device KIT; Use as instructed. Check blood glucose level by fingerstick twice per day. Will pick up today. -     glucose blood (TRUE METRIX BLOOD GLUCOSE TEST) test strip; Use as instructed. Check blood glucose level by fingerstick two times per day. Will pick up today. -     TRUEplus Lancets 28G MISC; Use as instructed. Check blood glucose level by fingerstick twice per day. Will pick up today. -     Hemoglobin A1c -     CMP14+EGFR -     TSH -     Ambulatory referral to Ophthalmology -     Microalbumin / creatinine urine ratio Continue blood sugar control as discussed in office today, low carbohydrate diet, and regular physical exercise as tolerated, 150 minutes per week (30 min each day, 5 days per week, or 50 min 3 days per  week). Keep blood sugar logs with fasting goal of 90-130 mg/dl, post prandial (after you eat) less than 180.  For Hypoglycemia: BS <60 and Hyperglycemia BS >400; contact the clinic ASAP. Annual eye exams and foot exams are recommended.   Colon cancer screening -     Fecal occult blood, imunochemical([facility]); Future -     Fecal occult blood, imunochemical([facility]/Sunquest)  Dyslipidemia, goal LDL below 70 -     Lipid panel INSTRUCTIONS: Work on a low fat, heart healthy diet and participate in regular aerobic exercise program by working out at least 150 minutes per week; 5 days a week-30 minutes per day. Avoid red meat/beef/steak,  fried foods. junk foods, sodas, sugary drinks, unhealthy snacking, alcohol and smoking.  Drink at least 80 oz of water per day and monitor your carbohydrate intake daily.    Prostate cancer screening -     PSA  Screening for deficiency anemia -  CBC  Tobacco dependence Atharva was counseled on the dangers of tobacco use, and was advised to quit. Reviewed strategies to maximize success, including removing cigarettes and smoking materials from environment, stress management and support of family/friends as well as pharmacological alternatives including: Wellbutrin, Chantix, Nicotine patch, Nicotine gum or lozenges. Smoking cessation support: smoking cessation hotline: 1-800-QUIT-NOW.  Smoking cessation classes are also available through [facility]. Call [phone] or visit our website at [email or web address]   A total of 3 minutes was spent on counseling for smoking cessation and [place] is not ready to quit.    Follow Up Instructions Return in about 3 months (around 06/24/2019).     I discussed the assessment and treatment plan with the patient. The patient was provided an opportunity to ask questions and all were answered. The patient agreed with the plan and demonstrated an understanding of the instructions.   The  patient was advised to call back or seek an in-person evaluation if the symptoms worsen or if the condition fails to improve as anticipated.  I provided 19 minutes of non-face-to-face time during this encounter including median intraservice time, reviewing previous notes, labs, imaging, medications and explaining diagnosis and management.  Gildardo Pounds, FNP-BC

## 2019-03-28 ENCOUNTER — Other Ambulatory Visit: Payer: Self-pay | Admitting: Nurse Practitioner

## 2019-03-28 LAB — CBC
Hematocrit: 50.1 % (ref 37.5–51.0)
Hemoglobin: 17.1 g/dL (ref 13.0–17.7)
MCH: 32 pg (ref 26.6–33.0)
MCHC: 34.1 g/dL (ref 31.5–35.7)
MCV: 94 fL (ref 79–97)
Platelets: 235 10*3/uL (ref 150–450)
RBC: 5.34 x10E6/uL (ref 4.14–5.80)
RDW: 12.6 % (ref 11.6–15.4)
WBC: 7.6 10*3/uL (ref 3.4–10.8)

## 2019-03-28 LAB — MICROALBUMIN / CREATININE URINE RATIO
Creatinine, Urine: 130 mg/dL
Microalb/Creat Ratio: 3 mg/g creat (ref 0–29)
Microalbumin, Urine: 3.6 ug/mL

## 2019-03-28 LAB — CMP14+EGFR
ALT: 14 IU/L (ref 0–44)
AST: 24 IU/L (ref 0–40)
Albumin/Globulin Ratio: 1.6 (ref 1.2–2.2)
Albumin: 4.6 g/dL (ref 3.8–4.9)
Alkaline Phosphatase: 69 IU/L (ref 39–117)
BUN/Creatinine Ratio: 11 (ref 9–20)
BUN: 11 mg/dL (ref 6–24)
Bilirubin Total: 0.5 mg/dL (ref 0.0–1.2)
CO2: 23 mmol/L (ref 20–29)
Calcium: 9.6 mg/dL (ref 8.7–10.2)
Chloride: 104 mmol/L (ref 96–106)
Creatinine, Ser: 1.03 mg/dL (ref 0.76–1.27)
GFR calc Af Amer: 93 mL/min/{1.73_m2} (ref >59)
GFR calc non Af Amer: 81 mL/min/{1.73_m2} (ref >59)
Globulin, Total: 2.9 g/dL (ref 1.5–4.5)
Glucose: 97 mg/dL (ref 65–99)
Potassium: 4.6 mmol/L (ref 3.5–5.2)
Sodium: 143 mmol/L (ref 134–144)
Total Protein: 7.5 g/dL (ref 6.0–8.5)

## 2019-03-28 LAB — LIPID PANEL
Chol/HDL Ratio: 2.9 ratio (ref 0.0–5.0)
Cholesterol, Total: 146 mg/dL (ref 100–199)
HDL: 51 mg/dL (ref >39)
LDL Chol Calc (NIH): 84 mg/dL (ref 0–99)
Triglycerides: 50 mg/dL (ref 0–149)
VLDL Cholesterol Cal: 11 mg/dL (ref 5–40)

## 2019-03-28 LAB — FECAL OCCULT BLOOD, IMMUNOCHEMICAL: Fecal Occult Bld: NEGATIVE

## 2019-03-28 LAB — TSH: TSH: 1.41 u[IU]/mL (ref 0.450–4.500)

## 2019-03-28 LAB — HEMOGLOBIN A1C
Est. average glucose Bld gHb Est-mCnc: 137 mg/dL
Hgb A1c MFr Bld: 6.4 % — ABNORMAL HIGH (ref 4.8–5.6)

## 2019-03-28 LAB — PSA: Prostate Specific Ag, Serum: 2.7 ng/mL (ref 0.0–4.0)

## 2019-03-28 MED ORDER — METFORMIN HCL 500 MG PO TABS: 500.0000 mg | ORAL_TABLET | Freq: Every day | ORAL | 1 refills | Status: DC

## 2019-04-02 ENCOUNTER — Encounter: Payer: Self-pay | Admitting: Nurse Practitioner

## 2019-04-02 MED ORDER — ATORVASTATIN CALCIUM 20 MG PO TABS: 20.0000 mg | ORAL_TABLET | Freq: Every day | ORAL | 3 refills | Status: DC

## 2019-06-28 ENCOUNTER — Other Ambulatory Visit: Payer: Self-pay | Admitting: Nurse Practitioner

## 2019-06-28 DIAGNOSIS — R7303 Prediabetes: Secondary | ICD-10-CM

## 2019-06-29 ENCOUNTER — Ambulatory Visit: Payer: Self-pay | Attending: Nurse Practitioner

## 2019-06-29 ENCOUNTER — Other Ambulatory Visit: Payer: Self-pay

## 2019-06-29 DIAGNOSIS — R7303 Prediabetes: Secondary | ICD-10-CM

## 2019-06-30 LAB — CMP14+EGFR
ALT: 14 IU/L (ref 0–44)
AST: 18 IU/L (ref 0–40)
Albumin/Globulin Ratio: 1.7 (ref 1.2–2.2)
Albumin: 4.3 g/dL (ref 3.8–4.9)
Alkaline Phosphatase: 80 IU/L (ref 39–117)
BUN/Creatinine Ratio: 10 (ref 9–20)
BUN: 12 mg/dL (ref 6–24)
Bilirubin Total: 0.3 mg/dL (ref 0.0–1.2)
CO2: 22 mmol/L (ref 20–29)
Calcium: 9.4 mg/dL (ref 8.7–10.2)
Chloride: 105 mmol/L (ref 96–106)
Creatinine, Ser: 1.25 mg/dL (ref 0.76–1.27)
GFR calc Af Amer: 74 mL/min/{1.73_m2} (ref >59)
GFR calc non Af Amer: 64 mL/min/{1.73_m2} (ref >59)
Globulin, Total: 2.6 g/dL (ref 1.5–4.5)
Glucose: 187 mg/dL — ABNORMAL HIGH (ref 65–99)
Potassium: 4.5 mmol/L (ref 3.5–5.2)
Sodium: 139 mmol/L (ref 134–144)
Total Protein: 6.9 g/dL (ref 6.0–8.5)

## 2019-06-30 LAB — HEMOGLOBIN A1C
Est. average glucose Bld gHb Est-mCnc: 137 mg/dL
Hgb A1c MFr Bld: 6.4 % — ABNORMAL HIGH (ref 4.8–5.6)

## 2019-08-01 ENCOUNTER — Encounter (HOSPITAL_COMMUNITY): Payer: Self-pay | Admitting: Emergency Medicine

## 2019-08-01 ENCOUNTER — Other Ambulatory Visit: Payer: Self-pay

## 2019-08-01 ENCOUNTER — Ambulatory Visit (HOSPITAL_COMMUNITY)
Admission: EM | Admit: 2019-08-01 | Discharge: 2019-08-01 | Disposition: A | Discharge status: Home / Self Care | Payer: Self-pay | Attending: Emergency Medicine | Admitting: Emergency Medicine

## 2019-08-01 DIAGNOSIS — L739 Follicular disorder, unspecified: Secondary | ICD-10-CM

## 2019-08-01 DIAGNOSIS — L0291 Cutaneous abscess, unspecified: Secondary | ICD-10-CM

## 2019-08-01 MED ORDER — CEPHALEXIN 500 MG PO CAPS: 500.0000 mg | ORAL_CAPSULE | Freq: Four times a day (QID) | ORAL | 0 refills | Status: DC

## 2019-08-01 MED ORDER — TRAMADOL HCL 50 MG PO TABS: 50.0000 mg | ORAL_TABLET | Freq: Four times a day (QID) | ORAL | 0 refills | Status: DC | PRN

## 2019-08-01 NOTE — ED Provider Notes (Signed)
[place]    CSN: [id number] Arrival date & time: 08/01/19  1858      History   Chief Complaint Chief Complaint  Patient presents with  . Abscess    HPI Matthew Montgomery is a 59 y.o. male.   Moderate size abscess to lt inner area of nostril for 3 days now after cutting nose hair. States that he "popped " the area and has a good amount of drainage but hurts bad. Pt is a diabetic but said his sugar is under control.      Past Medical History:  Diagnosis Date  . 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 Decreased visual acuity 10/01/2006  . Diabetes mellitus without complication ([place]) 08/3530     Patient Active Problem List   Diagnosis Date Noted  . Pain of molar 06/26/2016  . Heme positive stool 08/09/2015  . Dry skin dermatitis 08/09/2015  . Tinea pedis, right 08/09/2015  . Diabetes mellitus type 2, controlled ([place]) 03/08/2014  . GERD (gastroesophageal reflux disease) 03/08/2014  . Light smoker 03/08/2014    History reviewed. No pertinent surgical history.     Home Medications    Prior to Admission medications   Medication Sig Start Date End Date Taking? Authorizing Provider  atorvastatin (LIPITOR) 20 MG tablet Take 1 tablet (20 mg total) by mouth daily. 04/02/19   Gildardo Pounds, NP  Blood Glucose Monitoring Suppl (TRUE METRIX METER) w/Device KIT Use as instructed. Check blood glucose level by fingerstick twice per day. Will pick up today. 03/27/19   Gildardo Pounds, NP  cephALEXin (KEFLEX) 500 MG capsule Take 1 capsule (500 mg total) by mouth 4 (four) times daily. 08/01/19   Marney Setting, NP  glucose blood (TRUE METRIX BLOOD GLUCOSE TEST) test strip Use as instructed. Check blood glucose level by fingerstick two times per day. Will pick up today. 03/27/19   Gildardo Pounds, NP  metFORMIN (GLUCOPHAGE) 500 MG tablet Take 1 tablet (500 mg total) by mouth daily with breakfast. 03/28/19 06/26/19  Gildardo Pounds, NP  traMADol (ULTRAM) 50 MG tablet Take 1 tablet  (50 mg total) by mouth every 6 (six) hours as needed. 08/01/19   Marney Setting, NP  TRUEplus Lancets 28G MISC Use as instructed. Check blood glucose level by fingerstick twice per day. Will pick up today. 03/27/19   Gildardo Pounds, NP    Family History Family History  Problem Relation Age of Onset  . Hypertension Mother   . Diabetes Mother   . Diabetes Father   . Cancer Father     Social History Social History   Tobacco Use  . Smoking status: Current Every Day Smoker    Packs/day: 0.50    Types: Cigarettes  . Smokeless tobacco: Never Used  Substance Use Topics  . Alcohol use: Yes    Comment: socially  . Drug use: Yes    Frequency: 2.0 times per week    Types: Marijuana, Cocaine     Allergies   Patient has no known allergies.   Review of Systems Review of Systems  Constitutional: Negative.   HENT:       Lt inner nostirol pimple with drainage thick yellow, painful.   Eyes: Negative.   Respiratory: Negative.   Cardiovascular: Negative.   Skin: Positive for wound.  Neurological: Negative.      Physical Exam Triage Vital Signs ED Triage Vitals  Enc Vitals Group     BP 08/01/19 1914 131/84     Pulse  Rate 08/01/19 1914 75     Resp 08/01/19 1914 18     Temp 08/01/19 1914 99.4 F (37.4 C)     Temp Source 08/01/19 1914 Oral     SpO2 08/01/19 1914 96 %     Weight --      Height --      Head Circumference --      Peak Flow --      Pain Score 08/01/19 1911 7     Pain Loc --      Pain Edu? --      Excl. in [place]? --    No data found.  Updated Vital Signs BP 131/84 (BP Location: Right Arm)   Pulse 75   Temp 99.4 F (37.4 C) (Oral)   Resp 18   SpO2 96%   Visual Acuity     Physical Exam HENT:     Right Ear: Tympanic membrane normal.     Left Ear: Tympanic membrane normal.     Nose:     Comments: Lt lower inner corner of nostril has a moderate size opening with yellow drainage coming from area, erythema noted around area and radiates into nasal  cavity.     Mouth/Throat:     Mouth: Mucous membranes are moist.  Eyes:     Pupils: Pupils are equal, round, and reactive to light.  Cardiovascular:     Rate and Rhythm: Normal rate.  Pulmonary:     Effort: Pulmonary effort is normal.  Musculoskeletal:     Cervical back: Normal range of motion.  Skin:    Findings: Erythema and lesion present.  Neurological:     General: No focal deficit present.     Mental Status: He is alert.      UC Treatments / Results  Labs (all labs ordered are listed, but only abnormal results are displayed) Labs Reviewed - No data to display  EKG   Radiology No results found.  Procedures Procedures (including critical care time)  Medications Ordered in UC Medications - No data to display  Initial Impression / Assessment and Plan / UC Course  I have reviewed the triage vital signs and the nursing notes.  Pertinent labs & imaging results that were available during my care of the patient were reviewed by me and considered in my medical decision making (see chart for details).     Warm compress to area  Wash dry area well  Take full dose of antibiotics  discussed risk for healing with diabetes  Final Clinical Impressions(s) / UC Diagnoses   Final diagnoses:  Abscess  Folliculitis     Discharge Instructions     Warm compress to area  Wash dry area well  Take full dose of antibiotics     ED Prescriptions    Medication Sig Dispense Auth. Provider   cephALEXin (KEFLEX) 500 MG capsule Take 1 capsule (500 mg total) by mouth 4 (four) times daily. 20 capsule Morley Kos L, NP   traMADol (ULTRAM) 50 MG tablet Take 1 tablet (50 mg total) by mouth every 6 (six) hours as needed. 15 tablet Marney Setting, NP     I have reviewed the PDMP during this encounter.   Marney Setting, NP 08/01/19 1932

## 2019-08-01 NOTE — ED Triage Notes (Signed)
Reports an ingrown hair to left nostril.  This was noticed last week.  Since then he popped the bump. Patient reports site draining, causing pain to left side of face.

## 2019-08-01 NOTE — Discharge Instructions (Addendum)
Warm compress to area  Wash dry area well  Take full dose of antibiotics

## 2019-08-08 ENCOUNTER — Other Ambulatory Visit: Payer: Self-pay

## 2019-08-08 ENCOUNTER — Encounter (HOSPITAL_COMMUNITY): Payer: Self-pay

## 2019-08-08 ENCOUNTER — Ambulatory Visit (HOSPITAL_COMMUNITY)
Admission: EM | Admit: 2019-08-08 | Discharge: 2019-08-08 | Disposition: A | Discharge status: Home / Self Care | Payer: Self-pay | Attending: Physician Assistant | Admitting: Physician Assistant

## 2019-08-08 DIAGNOSIS — R0982 Postnasal drip: Secondary | ICD-10-CM | POA: Insufficient documentation

## 2019-08-08 DIAGNOSIS — R0981 Nasal congestion: Secondary | ICD-10-CM | POA: Insufficient documentation

## 2019-08-08 DIAGNOSIS — J029 Acute pharyngitis, unspecified: Secondary | ICD-10-CM | POA: Insufficient documentation

## 2019-08-08 LAB — POCT RAPID STREP A: Streptococcus, Group A Screen (Direct): NEGATIVE

## 2019-08-08 MED ORDER — MUCINEX 600 MG PO TB12
600.0000 mg | ORAL_TABLET | Freq: Two times a day (BID) | ORAL | 0 refills | Status: AC
Start: 2019-08-08 — End: 2019-08-15

## 2019-08-08 MED ORDER — CETIRIZINE HCL 10 MG PO TABS
10.0000 mg | ORAL_TABLET | Freq: Every day | ORAL | 0 refills | Status: DC
Start: 2019-08-08 — End: 2020-09-01

## 2019-08-08 MED ORDER — SALINE SPRAY 0.65 % NA SOLN: 1.0000 | NASAL | 0 refills | Status: DC | PRN

## 2019-08-08 MED ORDER — FLUTICASONE PROPIONATE 50 MCG/ACT NA SUSP
1.0000 | Freq: Every day | NASAL | 0 refills | Status: DC
Start: 2019-08-08 — End: 2020-09-01

## 2019-08-08 NOTE — Discharge Instructions (Signed)
I believe this is related to postnasal drip  We have sent the culture on the strep test, we will call if we need to change treatments  Use flonase daily Zyrtec daily Nasal saline throughout day Mucinex twice daily as needed for mucous  Drink plenty of water to loosen secretions  If not improving over the next 3-4 days follow up with your PCP or this clinic

## 2019-08-08 NOTE — ED Provider Notes (Signed)
[place]    CSN: [id number] Arrival date & time: 08/08/19  1345      History   Chief Complaint Chief Complaint  Patient presents with  . Sore Throat    HPI Matthew Montgomery is a 59 y.o. male.   Patient reports evaluation of sore throat and feeling like he has to cough something up out of his throat.  He reports he woke up this morning feeling that there is something in his throat.  He reports he feels like it is a gargling sensation.  He denies difficulty swallowing or feel like throat is swelling.  He also reports some soreness in the throat.  Pain is not significant just notices it being there.  He reports he also has some nasal congestion and has to blow his nose every morning.  He reports he would take Benadryl for this every now and then and this helped significantly.  Reports occasional sneezing.  He has not had any cough, fever, headaches.  He is not sure how long he has been having the congestion present.  Denies any facial pain.     Past Medical History:  Diagnosis Date  . 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 Decreased visual acuity 10/01/2006  . Diabetes mellitus without complication ([place]) 05/4313     Patient Active Problem List   Diagnosis Date Noted  . Pain of molar 06/26/2016  . Heme positive stool 08/09/2015  . Dry skin dermatitis 08/09/2015  . Tinea pedis, right 08/09/2015  . Diabetes mellitus type 2, controlled ([place]) 03/08/2014  . GERD (gastroesophageal reflux disease) 03/08/2014  . Light smoker 03/08/2014    History reviewed. No pertinent surgical history.     Home Medications    Prior to Admission medications   Medication Sig Start Date End Date Taking? Authorizing Provider  atorvastatin (LIPITOR) 20 MG tablet Take 1 tablet (20 mg total) by mouth daily. 04/02/19   Gildardo Pounds, NP  Blood Glucose Monitoring Suppl (TRUE METRIX METER) w/Device KIT Use as instructed. Check blood glucose level by fingerstick twice per day. Will pick up today.  03/27/19   Gildardo Pounds, NP  cephALEXin (KEFLEX) 500 MG capsule Take 1 capsule (500 mg total) by mouth 4 (four) times daily. 08/01/19   Marney Setting, NP  cetirizine (ZYRTEC ALLERGY) 10 MG tablet Take 1 tablet (10 mg total) by mouth daily. 08/08/19   Katheline Brendlinger, Marguerita Beards, PA-C  fluticasone (FLONASE) 50 MCG/ACT nasal spray Place 1 spray into both nostrils daily. 08/08/19   Jamauri Kruzel, Marguerita Beards, PA-C  glucose blood (TRUE METRIX BLOOD GLUCOSE TEST) test strip Use as instructed. Check blood glucose level by fingerstick two times per day. Will pick up today. 03/27/19   Gildardo Pounds, NP  guaiFENesin (MUCINEX) 600 MG 12 hr tablet Take 1 tablet (600 mg total) by mouth 2 (two) times daily for 7 days. 08/08/19 08/15/19  Roemello Speyer, Marguerita Beards, PA-C  metFORMIN (GLUCOPHAGE) 500 MG tablet Take 1 tablet (500 mg total) by mouth daily with breakfast. 03/28/19 06/26/19  Gildardo Pounds, NP  sodium chloride (OCEAN) 0.65 % SOLN nasal spray Place 1 spray into both nostrils as needed for congestion. 08/08/19   Ogle Hoeffner, Marguerita Beards, PA-C  traMADol (ULTRAM) 50 MG tablet Take 1 tablet (50 mg total) by mouth every 6 (six) hours as needed. 08/01/19   Marney Setting, NP  TRUEplus Lancets 28G MISC Use as instructed. Check blood glucose level by fingerstick twice per day. Will pick up today. 03/27/19  Gildardo Pounds, NP    Family History Family History  Problem Relation Age of Onset  . Hypertension Mother   . Diabetes Mother   . Diabetes Father   . Cancer Father     Social History Social History   Tobacco Use  . Smoking status: Current Every Day Smoker    Packs/day: 0.50    Types: Cigarettes  . Smokeless tobacco: Never Used  Vaping Use  . Vaping Use: Never used  Substance Use Topics  . Alcohol use: Yes    Comment: socially  . Drug use: Yes    Frequency: 2.0 times per week    Types: Marijuana, Cocaine     Allergies   Patient has no known allergies.   Review of Systems Review of Systems   Physical Exam Triage Vital  Signs ED Triage Vitals [08/08/19 1403]  Enc Vitals Group     BP 116/75     Pulse Rate 64     Resp 16     Temp (!) 97 F (36.1 C)     Temp src      SpO2 96 %     Weight      Height      Head Circumference      Peak Flow      Pain Score 5     Pain Loc      Pain Edu?      Excl. in [place]?    No data found.  Updated Vital Signs BP 116/75   Pulse 64   Temp (!) 97 F (36.1 C)   Resp 16   SpO2 96%   Visual Acuity Right Eye Distance:   Left Eye Distance:   Bilateral Distance:    Right Eye Near:   Left Eye Near:    Bilateral Near:     Physical Exam Vitals and nursing note reviewed.  Constitutional:      General: He is not in acute distress.    Appearance: He is well-developed. He is not ill-appearing.  HENT:     Head: Normocephalic and atraumatic.     Comments: No frontal or maxillary sinus tenderness    Nose: Congestion and rhinorrhea present.     Mouth/Throat:     Mouth: Mucous membranes are moist.     Comments: Significant postnasal drip visible.  Minimal erythema.  Tonsils not swollen.  Uvula midline without swelling Eyes:     Conjunctiva/sclera: Conjunctivae normal.  Cardiovascular:     Rate and Rhythm: Normal rate and regular rhythm.     Heart sounds: No murmur heard.   Pulmonary:     Effort: Pulmonary effort is normal. No respiratory distress.     Breath sounds: Normal breath sounds.  Musculoskeletal:     Cervical back: Neck supple.  Skin:    General: Skin is warm and dry.  Neurological:     Mental Status: He is alert.      UC Treatments / Results  Labs (all labs ordered are listed, but only abnormal results are displayed) Labs Reviewed  CULTURE, GROUP A STREP [facility])  POCT RAPID STREP A    EKG   Radiology No results found.  Procedures Procedures (including critical care time)  Medications Ordered in UC Medications - No data to display  Initial Impression / Assessment and Plan / UC Course  I have reviewed the triage vital signs and  the nursing notes.  Pertinent labs & imaging results that were available during my care of the patient  were reviewed by me and considered in my medical decision making (see chart for details).     #Pharyngitis #Nasal congestion #Postnasal drip Patient 59 year old presenting with above symptoms that are likely driven by chronic allergic rhinitis.  We did obtain a rapid strep given sore throat and this was negative, send culture.  Will defer Covid testing as I believe this is a chronic issue and he has no other associated symptoms.  Will start on Flonase and Zyrtec.  Mucinex twice daily as well with nasal saline throughout the day.  Instructed to hydrate well.  To follow-up with primary care if not improving.  Patient verbalized understanding the plan   Final Clinical Impressions(s) / UC Diagnoses   Final diagnoses:  Pharyngitis, unspecified etiology  Nasal congestion  Postnasal drip     Discharge Instructions     I believe this is related to postnasal drip  We have sent the culture on the strep test, we will call if we need to change treatments  Use flonase daily Zyrtec daily Nasal saline throughout day Mucinex twice daily as needed for mucous  Drink plenty of water to loosen secretions  If not improving over the next 3-4 days follow up with your PCP or this clinic      ED Prescriptions    Medication Sig Dispense Auth. Provider   fluticasone (FLONASE) 50 MCG/ACT nasal spray Place 1 spray into both nostrils daily. 11.1 mL Malayah Demuro, Marguerita Beards, PA-C   cetirizine (ZYRTEC ALLERGY) 10 MG tablet Take 1 tablet (10 mg total) by mouth daily. 30 tablet Courtnie Brenes, Marguerita Beards, PA-C   sodium chloride (OCEAN) 0.65 % SOLN nasal spray Place 1 spray into both nostrils as needed for congestion. 44 mL Halil Rentz, Marguerita Beards, PA-C   guaiFENesin (MUCINEX) 600 MG 12 hr tablet Take 1 tablet (600 mg total) by mouth 2 (two) times daily for 7 days. 14 tablet Kenlynn Houde, Marguerita Beards, PA-C     PDMP not reviewed this encounter.    Purnell Shoemaker, PA-C 08/08/19 1533

## 2019-08-08 NOTE — ED Triage Notes (Signed)
Pt c/o waking up with pain when swallowing, states it feels like he is talking under water. No airway compromise, patient able to tolerate PO intake.

## 2019-08-08 NOTE — ED Notes (Signed)
Obtained throat swab, verified label and placed in lab

## 2019-08-11 LAB — CULTURE, GROUP A STREP (THRC)

## 2019-08-21 ENCOUNTER — Other Ambulatory Visit: Payer: Self-pay

## 2019-08-21 ENCOUNTER — Encounter (HOSPITAL_COMMUNITY): Payer: Self-pay

## 2019-08-21 ENCOUNTER — Ambulatory Visit (HOSPITAL_COMMUNITY)
Admission: EM | Admit: 2019-08-21 | Discharge: 2019-08-21 | Disposition: A | Discharge status: Home / Self Care | Payer: Self-pay | Attending: Family Medicine | Admitting: Family Medicine

## 2019-08-21 DIAGNOSIS — Z202 Contact with and (suspected) exposure to infections with a predominantly sexual mode of transmission: Secondary | ICD-10-CM

## 2019-08-21 DIAGNOSIS — R109 Unspecified abdominal pain: Secondary | ICD-10-CM

## 2019-08-21 LAB — POCT URINALYSIS DIP (DEVICE)
Bilirubin Urine: NEGATIVE
Glucose, UA: NEGATIVE mg/dL
Hgb urine dipstick: NEGATIVE
Ketones, ur: NEGATIVE mg/dL
Leukocytes,Ua: NEGATIVE
Nitrite: NEGATIVE
Protein, ur: NEGATIVE mg/dL
Specific Gravity, Urine: >=1.03 (ref 1.005–1.030)
Urobilinogen, UA: 0.2 mg/dL (ref 0.0–1.0)
pH: 6 (ref 5.0–8.0)

## 2019-08-21 MED ORDER — METRONIDAZOLE 500 MG PO TABS
500.0000 mg | ORAL_TABLET | Freq: Two times a day (BID) | ORAL | 0 refills | Status: AC
Start: 2019-08-21 — End: 2019-08-28

## 2019-08-21 NOTE — ED Triage Notes (Addendum)
Pt presents with lower abdominal pain and lower back pain x 2 days, worse when walking. Pt requested STD's, sexual partner tested positive for trichomoniasis 3 days ago.  Pt denies penile discharge, burning, itchiness.

## 2019-08-21 NOTE — Discharge Instructions (Signed)
Please take the medicine  We will inform you of any positives  Please follow up if your symptoms fail to improve.

## 2019-08-21 NOTE — ED Notes (Signed)
Patient requesting warm blanket and STD screening.

## 2019-08-21 NOTE — ED Provider Notes (Signed)
[place]    CSN: [id number] Arrival date & time: 08/21/19  1658      History   Chief Complaint Chief Complaint  Patient presents with  . Abdominal Pain    HPI Matthew Montgomery is a 59 y.o. male.   He is presenting with suprapubic pain and exposure to someone with trichomonas.  Symptoms started 2 days ago.  Denies any penile discharge.  Denies dysuria.  Denies any history of hernia.  No history of nephrolithiasis.  No abdominal surgery.  Has a history of previous sexually transmitted infections that were treated.  HPI  Past Medical History:  Diagnosis Date  . 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 Decreased visual acuity 10/01/2006  . Diabetes mellitus without complication ([employer]) 0/7622     Patient Active Problem List   Diagnosis Date Noted  . Pain of molar 06/26/2016  . Heme positive stool 08/09/2015  . Dry skin dermatitis 08/09/2015  . Tinea pedis, right 08/09/2015  . Diabetes mellitus type 2, controlled ([place]) 03/08/2014  . GERD (gastroesophageal reflux disease) 03/08/2014  . Light smoker 03/08/2014    History reviewed. No pertinent surgical history.     Home Medications    Prior to Admission medications   Medication Sig Start Date End Date Taking? Authorizing Provider  atorvastatin (LIPITOR) 20 MG tablet Take 1 tablet (20 mg total) by mouth daily. 04/02/19   Gildardo Pounds, NP  Blood Glucose Monitoring Suppl (TRUE METRIX METER) w/Device KIT Use as instructed. Check blood glucose level by fingerstick twice per day. Will pick up today. 03/27/19   Gildardo Pounds, NP  cephALEXin (KEFLEX) 500 MG capsule Take 1 capsule (500 mg total) by mouth 4 (four) times daily. 08/01/19   Marney Setting, NP  cetirizine (ZYRTEC ALLERGY) 10 MG tablet Take 1 tablet (10 mg total) by mouth daily. 08/08/19   Darr, Marguerita Beards, PA-C  fluticasone (FLONASE) 50 MCG/ACT nasal spray Place 1 spray into both nostrils daily. 08/08/19   Darr, Marguerita Beards, PA-C  glucose blood (TRUE METRIX BLOOD  GLUCOSE TEST) test strip Use as instructed. Check blood glucose level by fingerstick two times per day. Will pick up today. 03/27/19   Gildardo Pounds, NP  metFORMIN (GLUCOPHAGE) 500 MG tablet Take 1 tablet (500 mg total) by mouth daily with breakfast. 03/28/19 06/26/19  Gildardo Pounds, NP  metroNIDAZOLE (FLAGYL) 500 MG tablet Take 1 tablet (500 mg total) by mouth 2 (two) times daily for 7 days. 08/21/19 08/28/19  Rosemarie Ax, MD  sodium chloride (OCEAN) 0.65 % SOLN nasal spray Place 1 spray into both nostrils as needed for congestion. 08/08/19   Darr, Marguerita Beards, PA-C  traMADol (ULTRAM) 50 MG tablet Take 1 tablet (50 mg total) by mouth every 6 (six) hours as needed. 08/01/19   Marney Setting, NP  TRUEplus Lancets 28G MISC Use as instructed. Check blood glucose level by fingerstick twice per day. Will pick up today. 03/27/19   Gildardo Pounds, NP    Family History Family History  Problem Relation Age of Onset  . Hypertension Mother   . Diabetes Mother   . Diabetes Father   . Cancer Father     Social History Social History   Tobacco Use  . Smoking status: Current Every Day Smoker    Packs/day: 0.50    Types: Cigarettes  . Smokeless tobacco: Never Used  Vaping Use  . Vaping Use: Never used  Substance Use Topics  . Alcohol use: Yes  Comment: socially  . Drug use: Yes    Frequency: 2.0 times per week    Types: Marijuana, Cocaine     Allergies   Patient has no known allergies.   Review of Systems Review of Systems  See HPI  Physical Exam Triage Vital Signs ED Triage Vitals  Enc Vitals Group     BP 08/21/19 1825 (!) 137/99     Pulse Rate 08/21/19 1825 68     Resp 08/21/19 1825 16     Temp 08/21/19 1825 98.2 F (36.8 C)     Temp Source 08/21/19 1825 Oral     SpO2 08/21/19 1825 99 %     Weight --      Height --      Head Circumference --      Peak Flow --      Pain Score 08/21/19 1835 8     Pain Loc --      Pain Edu? --      Excl. in [place]? --    No data  found.  Updated Vital Signs BP (!) 137/99 (BP Location: Left Arm)   Pulse 68   Temp 98.2 F (36.8 C) (Oral)   Resp 16   SpO2 99%   Visual Acuity Right Eye Distance:   Left Eye Distance:   Bilateral Distance:    Right Eye Near:   Left Eye Near:    Bilateral Near:     Physical Exam Gen: NAD, alert, cooperative with exam, well-appearing ENT: normal lips, normal nasal mucosa,  Eye: normal EOM, normal conjunctiva and lids CV:  no edema, +2 pedal pulses   Resp: no accessory muscle use, non-labored,  GI: no masses or tenderness, no hernia  GU: No discharge, no tenderness of scrotal exam Skin: no rashes, no areas of induration      UC Treatments / Results  Labs (all labs ordered are listed, but only abnormal results are displayed) Labs Reviewed  POCT URINALYSIS DIP (DEVICE)  CYTOLOGY, (ORAL, ANAL, URETHRAL) ANCILLARY ONLY    EKG   Radiology No results found.  Procedures Procedures (including critical care time)  Medications Ordered in UC Medications - No data to display  Initial Impression / Assessment and Plan / UC Course  I have reviewed the triage vital signs and the nursing notes.  Pertinent labs & imaging results that were available during my care of the patient were reviewed by me and considered in my medical decision making (see chart for details).     [name] is a 59 year old male that is presenting with suprapubic pain and exposure to trichomonas.  Urinalysis was negative for infection or kidney stone.  Denies any bowel movement changes.  No signs of hernia on exam.  Cytology was obtained.  Provided with Flagyl.  Counseled supportive care.  Given indications follow-up.  Final Clinical Impressions(s) / UC Diagnoses   Final diagnoses:  Exposure to sexually transmitted disease (STD)     Discharge Instructions     Please take the medicine  We will inform you of any positives  Please follow up if your symptoms fail to improve.     ED  Prescriptions    Medication Sig Dispense Auth. Provider   metroNIDAZOLE (FLAGYL) 500 MG tablet Take 1 tablet (500 mg total) by mouth 2 (two) times daily for 7 days. 14 tablet Rosemarie Ax, MD     PDMP not reviewed this encounter.   Rosemarie Ax, MD 08/21/19 Einar Crow

## 2019-08-22 LAB — CYTOLOGY, (ORAL, ANAL, URETHRAL) ANCILLARY ONLY
Chlamydia: NEGATIVE
Comment: NEGATIVE
Comment: NEGATIVE
Comment: NORMAL
Neisseria Gonorrhea: NEGATIVE
Trichomonas: POSITIVE — AB

## 2019-11-07 ENCOUNTER — Other Ambulatory Visit: Payer: Self-pay

## 2020-02-29 ENCOUNTER — Ambulatory Visit: Payer: Self-pay | Admitting: *Deleted

## 2020-02-29 ENCOUNTER — Other Ambulatory Visit: Payer: Self-pay

## 2020-02-29 ENCOUNTER — Ambulatory Visit (HOSPITAL_COMMUNITY)
Admission: EM | Admit: 2020-02-29 | Discharge: 2020-02-29 | Disposition: A | Discharge status: Home / Self Care | Payer: HRSA Program | Attending: Family Medicine | Admitting: Family Medicine

## 2020-02-29 ENCOUNTER — Encounter (HOSPITAL_COMMUNITY): Payer: Self-pay

## 2020-02-29 DIAGNOSIS — U071 COVID-19: Secondary | ICD-10-CM | POA: Insufficient documentation

## 2020-02-29 DIAGNOSIS — R059 Cough, unspecified: Secondary | ICD-10-CM

## 2020-02-29 DIAGNOSIS — K921 Melena: Secondary | ICD-10-CM | POA: Insufficient documentation

## 2020-02-29 DIAGNOSIS — F17210 Nicotine dependence, cigarettes, uncomplicated: Secondary | ICD-10-CM | POA: Diagnosis not present

## 2020-02-29 LAB — SARS CORONAVIRUS 2 (TAT 6-24 HRS): SARS Coronavirus 2: POSITIVE — AB

## 2020-02-29 NOTE — ED Triage Notes (Signed)
Pt presents with blood in stool X 1 week with no complaints of pain.

## 2020-02-29 NOTE — Discharge Instructions (Signed)
You have been tested for COVID-19 today. °If your test returns positive, you will receive a phone call from [place] regarding your results. °Negative test results are not called. °Both positive and negative results area always visible on MyChart. °If you do not have a MyChart account, sign up instructions are provided in your discharge papers. °Please do not hesitate to contact us should you have questions or concerns. ° °

## 2020-02-29 NOTE — Telephone Encounter (Signed)
Pt called in c/o having bright red blood in his stools and on the toilet tissue for about the last week.  No diarrhea.  He was exposed to covid 19 at a [employer].  His brother tested positive.   Pt started having chills, fever, body aches on Wed. Following New Year's.  His test came back negative but he had the symptoms.   I went over the quarantine protocol.   He is at work when he called me.  There were no appts available at [facility] until into Feb. So I have referred him to the urgent care due to the ED being very long waits and full due to the covid pandemic going on now.  He was agreeable to going.  He is going to the [facility] today.  He does not have a history of constipation but did have some while sick with covid.  No diarrhea.  He goes regularly every morning.  No rectal pain, abd pain.  No rectal itching.  Denies any urinary symptoms.  I went over the care advice and referred him on to the urgent care.    Reason for Disposition . MILD rectal bleeding (more than just a few drops or streaks)  Answer Assessment - Initial Assessment Questions 1. APPEARANCE of BLOOD: "What color is it?" "Is it passed separately, on the surface of the stool, or mixed in with the stool?"      A week ago I started having blood in my stool.   I have lower back pain and into my thighs and private parts.   I do a lot of lifting at work.   I thought it was from straining lifting stuff.    2. AMOUNT: "How much blood was passed?"      The stool is dark.   No gastric history. 3. FREQUENCY: "How many times has blood been passed with the stools?"      No burning or urinary symptoms.   I see the bloody stool in the mornings mostly.     4. ONSET: "When was the blood first seen in the stools?" (Days or weeks)      This morning for the past week.   No abd pain. 5. DIARRHEA: "Is there also some diarrhea?" If Yes, ask: "How many diarrhea stools were passed in past 24 hours?"       No 6. CONSTIPATION: "Do you have constipation?" If Yes, ask: "How bad is it?"     Constipation  I usually go regularly.  7. RECURRENT SYMPTOMS: "Have you had blood in your stools before?" If Yes, ask: "When was the last time?" and "What happened that time?"      No 8. BLOOD THINNERS: "Do you take any blood thinners?" (e.g., Coumadin/warfarin, Pradaxa/dabigatran, aspirin)     No 9. OTHER SYMPTOMS: "Do you have any other symptoms?"  (e.g., abdominal pain, vomiting, dizziness, fever)     No dizziness or vomiting. I just got over covid.   New Year's Party I went to my brother was positive.  I started having chills Wed. After New Year's.   I'm fine from covid now.   I'm sitting on stool now with blood that is bright red on tissue.   10. PREGNANCY: "Is there any chance you are pregnant?" "When was your last menstrual period?"       N/A  Protocols used: RECTAL BLEEDING-A-AH

## 2020-03-05 NOTE — ED Provider Notes (Signed)
[facility]   [id number] 02/29/20 Arrival Time: 1154  ASSESSMENT & PLAN:  1. Blood in stool   2. Cough     Benign exam.   Strongly recommend GI f/u. Discussed. Reports neg colonoscopy 7-10 y ago.   Follow-up Information    Schedule an appointment as soon as possible for a visit  with Gastroenterology, Deboraha Sprang.   Contact information: [address] [place] [address] [phone]        Schedule an appointment as soon as possible for a visit  with Claiborne Rigg, NP.   Specialty: Nurse Practitioner Contact information: [address] [place] [address] [phone]              COVID testing sent.   Reviewed expectations re: course of current medical issues. Questions answered. Outlined signs and symptoms indicating need for more acute intervention. Patient verbalized understanding. After Visit Summary given.   SUBJECTIVE: History from: patient.  Matthew Montgomery is a 60 y.o. male who reports BRB per rectum/in stool; noted over past week only with BM. Does strain with BM. No rectal pain reported. Otherwise feeling well. Normal PO intake without n/v/d. Weight stable. No abd pain.  History reviewed. No pertinent surgical history.  OBJECTIVE:  Vitals:   02/29/20 1258  BP: (!) 112/91  Pulse: 68  Resp: 18  Temp: 98.3 F (36.8 C)  TempSrc: Oral  SpO2: 98%    General appearance: alert; no distress Oropharynx: moist Lungs: clear to auscultation bilaterally; unlabored Heart: regular rate and rhythm Abdomen: soft; non-distended; no significant abdominal tenderness; no masses or organomegaly; no guarding or rebound tenderness Rectal: no gross blood; no pain with exam; no masses appreciated Back: no CVA tenderness Extremities: no edema; symmetrical with no gross deformities Skin: warm; dry Neurologic: normal gait Psychological: alert and cooperative; normal mood and affect  No results found.  No Known Allergies                                              Past Medical History:  Diagnosis Date  . 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 Decreased visual acuity 10/01/2006  . Diabetes mellitus without complication ([facility]) 02/2014    Social History   Socioeconomic History  . Marital status: Single    Spouse name: Not on file  . Number of children: Not on file  . Years of education: Not on file  . Highest education level: Not on file  Occupational History  . Not on file  Tobacco Use  . Smoking status: Current Every Day Smoker    Packs/day: 0.50    Types: Cigarettes  . Smokeless tobacco: Never Used  Vaping Use  . Vaping Use: Never used  Substance and Sexual Activity  . Alcohol use: Yes    Comment: socially  . Drug use: Yes    Frequency: 2.0 times per week    Types: Marijuana, Cocaine  . Sexual activity: Yes  Other Topics Concern  . Not on file  Social History Narrative  . Not on file   Social Determinants of Health   Financial Resource Strain: Not on file  Food Insecurity: Not on file  Transportation Needs: Not on file  Physical Activity: Not on file  Stress: Not on file  Social Connections: Not on file  Intimate Partner Violence: Not on file   Family History  Problem  Relation Age of Onset  . Hypertension Mother   . Diabetes Mother   . Diabetes Father   . Cancer Father      Mardella Layman, MD 03/05/20 [phone]

## 2020-06-26 ENCOUNTER — Ambulatory Visit (HOSPITAL_COMMUNITY)
Admission: EM | Admit: 2020-06-26 | Discharge: 2020-06-26 | Disposition: A | Discharge status: Home / Self Care | Payer: 59 | Attending: Emergency Medicine | Admitting: Emergency Medicine

## 2020-06-26 ENCOUNTER — Other Ambulatory Visit: Payer: Self-pay

## 2020-06-26 ENCOUNTER — Encounter (HOSPITAL_COMMUNITY): Payer: Self-pay

## 2020-06-26 DIAGNOSIS — L03012 Cellulitis of left finger: Secondary | ICD-10-CM

## 2020-06-26 MED ORDER — DOXYCYCLINE HYCLATE 100 MG PO CAPS
100.0000 mg | ORAL_CAPSULE | Freq: Two times a day (BID) | ORAL | 0 refills | Status: DC
Start: 2020-06-26 — End: 2020-09-01

## 2020-06-26 NOTE — ED Provider Notes (Signed)
[place]    CSN: [id number] Arrival date & time: 06/26/20  1151      History   Chief Complaint Chief Complaint  Patient presents with  . Finger Infection     HPI Matthew Montgomery is a 60 y.o. male.   Patient here for evaluation of left ring finger infection.  Reports first noticed pain and swelling approximately 2 days ago.  Has not tried any OTC medications or treatments. Denies any trauma, injury, or other precipitating event.  Denies any specific alleviating or aggravating factors.  Denies any fevers, chest pain, shortness of breath, N/V/D, numbness, tingling, weakness, abdominal pain, or headaches.   ROS: As per HPI, all other pertinent ROS negative   The history is provided by the patient.    Past Medical History:  Diagnosis Date  . 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 Decreased visual acuity 10/01/2006  . Diabetes mellitus without complication ([place]) 08/8240     Patient Active Problem List   Diagnosis Date Noted  . Pain of molar 06/26/2016  . Heme positive stool 08/09/2015  . Dry skin dermatitis 08/09/2015  . Tinea pedis, right 08/09/2015  . Diabetes mellitus type 2, controlled ([place]) 03/08/2014  . GERD (gastroesophageal reflux disease) 03/08/2014  . Light smoker 03/08/2014    History reviewed. No pertinent surgical history.     Home Medications    Prior to Admission medications   Medication Sig Start Date End Date Taking? Authorizing Provider  doxycycline (VIBRAMYCIN) 100 MG capsule Take 1 capsule (100 mg total) by mouth 2 (two) times daily. 06/26/20  Yes Pearson Forster, NP  atorvastatin (LIPITOR) 20 MG tablet Take 1 tablet (20 mg total) by mouth daily. 04/02/19   Gildardo Pounds, NP  Blood Glucose Monitoring Suppl (TRUE METRIX METER) w/Device KIT Use as instructed. Check blood glucose level by fingerstick twice per day. Will pick up today. 03/27/19   Gildardo Pounds, NP  cephALEXin (KEFLEX) 500 MG capsule Take 1 capsule (500 mg total) by mouth 4 (four)  times daily. 08/01/19   Marney Setting, NP  cetirizine (ZYRTEC ALLERGY) 10 MG tablet Take 1 tablet (10 mg total) by mouth daily. 08/08/19   Darr, Edison Nasuti, PA-C  fluticasone (FLONASE) 50 MCG/ACT nasal spray Place 1 spray into both nostrils daily. 08/08/19   Darr, Edison Nasuti, PA-C  glucose blood (TRUE METRIX BLOOD GLUCOSE TEST) test strip Use as instructed. Check blood glucose level by fingerstick two times per day. Will pick up today. 03/27/19   Gildardo Pounds, NP  metFORMIN (GLUCOPHAGE) 500 MG tablet Take 1 tablet (500 mg total) by mouth daily with breakfast. 03/28/19 06/26/19  Gildardo Pounds, NP  sodium chloride (OCEAN) 0.65 % SOLN nasal spray Place 1 spray into both nostrils as needed for congestion. 08/08/19   Darr, Edison Nasuti, PA-C  traMADol (ULTRAM) 50 MG tablet Take 1 tablet (50 mg total) by mouth every 6 (six) hours as needed. 08/01/19   Marney Setting, NP  TRUEplus Lancets 28G MISC Use as instructed. Check blood glucose level by fingerstick twice per day. Will pick up today. 03/27/19   Gildardo Pounds, NP    Family History Family History  Problem Relation Age of Onset  . Hypertension Mother   . Diabetes Mother   . Diabetes Father   . Cancer Father     Social History Social History   Tobacco Use  . Smoking status: Current Every Day Smoker    Packs/day: 0.50    Types: Cigarettes  .  Smokeless tobacco: Never Used  Vaping Use  . Vaping Use: Never used  Substance Use Topics  . Alcohol use: Yes    Comment: socially  . Drug use: Yes    Frequency: 2.0 times per week    Types: Marijuana, Cocaine     Allergies   Patient has no known allergies.   Review of Systems Review of Systems  All other systems reviewed and are negative.    Physical Exam Triage Vital Signs ED Triage Vitals [06/26/20 1303]  Enc Vitals Group     BP (!) 125/94     Pulse Rate (!) 59     Resp 18     Temp 98.3 F (36.8 C)     Temp Source Oral     SpO2 99 %     Weight      Height      Head Circumference       Peak Flow      Pain Score 7     Pain Loc      Pain Edu?      Excl. in [place]?    No data found.  Updated Vital Signs BP (!) 125/94 (BP Location: Right Arm)   Pulse (!) 59   Temp 98.3 F (36.8 C) (Oral)   Resp 18   SpO2 99%   Visual Acuity Right Eye Distance:   Left Eye Distance:   Bilateral Distance:    Right Eye Near:   Left Eye Near:    Bilateral Near:     Physical Exam Vitals and nursing note reviewed.  Constitutional:      General: He is not in acute distress.    Appearance: Normal appearance. He is not ill-appearing, toxic-appearing or diaphoretic.  HENT:     Head: Normocephalic and atraumatic.  Eyes:     Conjunctiva/sclera: Conjunctivae normal.  Cardiovascular:     Rate and Rhythm: Normal rate.     Pulses: Normal pulses.  Pulmonary:     Effort: Pulmonary effort is normal.  Abdominal:     General: Abdomen is flat.  Musculoskeletal:        General: Normal range of motion.     Left hand: Swelling (left ring finger) present.     Cervical back: Normal range of motion.  Skin:    General: Skin is warm and dry.     Comments: See photo of left ring finger  Neurological:     General: No focal deficit present.     Mental Status: He is alert and oriented to person, place, and time.  Psychiatric:        Mood and Affect: Mood normal.        UC Treatments / Results  Labs (all labs ordered are listed, but only abnormal results are displayed) Labs Reviewed - No data to display  EKG   Radiology No results found.  Procedures Procedures (including critical care time)  Medications Ordered in UC Medications - No data to display  Initial Impression / Assessment and Plan / UC Course  I have reviewed the triage vital signs and the nursing notes.  Pertinent labs & imaging results that were available during my care of the patient were reviewed by me and considered in my medical decision making (see chart for details).     Assessment negative for red  flags or concerns.  Patient given option of attempting to drain paronychia here or trying antibiotics and Epsom salt soaks.  Patient has elected to try antibiotics and  soaks.  Doxycycline twice daily for the next 10 days.  Soak finger in Epsom salt or apply warm compress 3-4 times a day as needed.  Follow-up for worsening symptoms.  Final Clinical Impressions(s) / UC Diagnoses   Final diagnoses:  Paronychia of finger, left     Discharge Instructions     Take the doxycycline twice a day for the next 10 days.  You can take Ibuprofen or Naproxen as needed for pain.   Soak your finger in an epsom salt soak 3-4 times a day.   If the swelling gets worse or does not improve in the next few day, return for re-evaluation. You may also go to the Emergency Department for re-evaluation for worsening symptoms.     ED Prescriptions    Medication Sig Dispense Auth. Provider   doxycycline (VIBRAMYCIN) 100 MG capsule Take 1 capsule (100 mg total) by mouth 2 (two) times daily. 20 capsule Pearson Forster, NP     PDMP not reviewed this encounter.   Pearson Forster, NP 06/26/20 1422

## 2020-06-26 NOTE — Discharge Instructions (Addendum)
Take the doxycycline twice a day for the next 10 days.  You can take Ibuprofen or Naproxen as needed for pain.   Soak your finger in an epsom salt soak 3-4 times a day.   If the swelling gets worse or does not improve in the next few day, return for re-evaluation. You may also go to the Emergency Department for re-evaluation for worsening symptoms.

## 2020-06-26 NOTE — ED Triage Notes (Signed)
Pt presents with left ring finger infection for past few days.

## 2020-08-30 ENCOUNTER — Other Ambulatory Visit: Payer: Self-pay

## 2020-08-30 ENCOUNTER — Emergency Department (HOSPITAL_COMMUNITY): Payer: 59

## 2020-08-30 ENCOUNTER — Encounter (HOSPITAL_COMMUNITY): Payer: Self-pay | Admitting: Emergency Medicine

## 2020-08-30 ENCOUNTER — Emergency Department (HOSPITAL_COMMUNITY)
Admission: EM | Admit: 2020-08-30 | Discharge: 2020-08-30 | Discharge status: Against Medical Advice | Payer: 59 | Attending: Emergency Medicine | Admitting: Emergency Medicine

## 2020-08-30 DIAGNOSIS — R531 Weakness: Secondary | ICD-10-CM | POA: Diagnosis not present

## 2020-08-30 DIAGNOSIS — Z5321 Procedure and treatment not carried out due to patient leaving prior to being seen by health care provider: Secondary | ICD-10-CM | POA: Insufficient documentation

## 2020-08-30 LAB — CBC WITH DIFFERENTIAL/PLATELET
Abs Immature Granulocytes: 0.02 10*3/uL (ref 0.00–0.07)
Basophils Absolute: 0.1 10*3/uL (ref 0.0–0.1)
Basophils Relative: 1 %
Eosinophils Absolute: 0.2 10*3/uL (ref 0.0–0.5)
Eosinophils Relative: 2 %
HCT: 48.1 % (ref 39.0–52.0)
Hemoglobin: 16.3 g/dL (ref 13.0–17.0)
Immature Granulocytes: 0 %
Lymphocytes Relative: 37 %
Lymphs Abs: 2.8 10*3/uL (ref 0.7–4.0)
MCH: 31.7 pg (ref 26.0–34.0)
MCHC: 33.9 g/dL (ref 30.0–36.0)
MCV: 93.4 fL (ref 80.0–100.0)
Monocytes Absolute: 0.7 10*3/uL (ref 0.1–1.0)
Monocytes Relative: 9 %
Neutro Abs: 3.8 10*3/uL (ref 1.7–7.7)
Neutrophils Relative %: 51 %
Platelets: 262 10*3/uL (ref 150–400)
RBC: 5.15 MIL/uL (ref 4.22–5.81)
RDW: 12.6 % (ref 11.5–15.5)
WBC: 7.4 10*3/uL (ref 4.0–10.5)
nRBC: 0 % (ref 0.0–0.2)

## 2020-08-30 LAB — COMPREHENSIVE METABOLIC PANEL
ALT: 21 U/L (ref 0–44)
AST: 24 U/L (ref 15–41)
Albumin: 3.8 g/dL (ref 3.5–5.0)
Alkaline Phosphatase: 55 U/L (ref 38–126)
Anion gap: 6 (ref 5–15)
BUN: 13 mg/dL (ref 6–20)
CO2: 24 mmol/L (ref 22–32)
Calcium: 9.1 mg/dL (ref 8.9–10.3)
Chloride: 108 mmol/L (ref 98–111)
Creatinine, Ser: 0.92 mg/dL (ref 0.61–1.24)
GFR, Estimated: >60 mL/min (ref >60)
Glucose, Bld: 81 mg/dL (ref 70–99)
Potassium: 3.8 mmol/L (ref 3.5–5.1)
Sodium: 138 mmol/L (ref 135–145)
Total Bilirubin: 0.9 mg/dL (ref 0.3–1.2)
Total Protein: 6.7 g/dL (ref 6.5–8.1)

## 2020-08-30 LAB — CBG MONITORING, ED: Glucose-Capillary: 92 mg/dL (ref 70–99)

## 2020-08-30 LAB — TROPONIN I (HIGH SENSITIVITY): Troponin I (High Sensitivity): 2 ng/L (ref <18)

## 2020-08-30 NOTE — ED Provider Notes (Signed)
  Emergency Medicine Provider Triage Evaluation Note  Alquan Morrish , a 60 y.o. male  was evaluated in triage.  Pt complains of patient is a 60 year old gentleman states he has a history of diabetes and used to take insulin but was taken off insulin over a year ago has not taken any medications since that time.  Patient states that today he woke up with a lot of dialysis felt very lightheaded as if he was going passout denies any vertiginous symptoms or any slurred speech or confusion.  He states he laid back down in his bed, got back up sometime later and felt the same way.  Denies any chest pain or shortness of breath.  Endorses some mild headache.  No other significant associated symptoms.  Review of Systems  Positive: Lightheaded, presyncopal Negative: Chest pain shortness of breath  Physical Exam  BP 116/75   Pulse (!) 52   Temp 98.1 F (36.7 C) (Oral)   Resp 12   SpO2 100%  Gen:   Awake, no distress   Resp:  Normal effort  MSK:   Moves extremities without difficulty  Other:    Medical Decision Making  Medically screening exam initiated at 3:43 PM.  Appropriate orders placed.  Jarrius Huaracha Queenan was informed that the remainder of the evaluation will be completed by another provider, this initial triage assessment does not replace that evaluation, and the importance of remaining in the ED until their evaluation is complete.  60 year old male presented today with lightheadedness and near syncope.  Symptoms of been persistent over the course of the day or since he tried to get out of bed this morning.  Given the vague symptoms the patient is endorsing we will obtain basic labs, 1 single troponin, chest x-ray and EKG.  I reviewed EKG triage which is normal sinus rhythm no significant axis deviation no ST-T wave abnormalities.   Will wait for rooming.    Gailen Shelter, [place] 08/30/20 1549    Wynetta Fines, MD 09/01/20 2055

## 2020-08-30 NOTE — ED Notes (Signed)
Patient called for room assignment four times outside and in lobby with no response

## 2020-08-30 NOTE — ED Triage Notes (Signed)
Pt presents with complains of patient is a 60 year old gentleman states he has a history of diabetes and used to take insulin but was taken off insulin over a year ago has not taken any medications since that time.  Patient states that today he woke up with a lot of dialysis felt very lightheaded as if he was going passout denies any vertiginous symptoms or any slurred speech or confusion.  No chest pain. A/ox4

## 2020-09-01 ENCOUNTER — Other Ambulatory Visit: Payer: Self-pay

## 2020-09-01 ENCOUNTER — Ambulatory Visit (HOSPITAL_COMMUNITY)
Admission: EM | Admit: 2020-09-01 | Discharge: 2020-09-01 | Disposition: A | Discharge status: Home / Self Care | Payer: 59 | Attending: Internal Medicine | Admitting: Internal Medicine

## 2020-09-01 DIAGNOSIS — M25512 Pain in left shoulder: Secondary | ICD-10-CM | POA: Diagnosis not present

## 2020-09-01 DIAGNOSIS — R42 Dizziness and giddiness: Secondary | ICD-10-CM | POA: Diagnosis not present

## 2020-09-01 LAB — CBG MONITORING, ED: Glucose-Capillary: 113 mg/dL — ABNORMAL HIGH (ref 70–99)

## 2020-09-01 MED ORDER — SCOPOLAMINE 1 MG/3DAYS TD PT72: 1.0000 | MEDICATED_PATCH | TRANSDERMAL | 5 refills | Status: DC

## 2020-09-01 MED ORDER — MELOXICAM 15 MG PO TABS: 15.0000 mg | ORAL_TABLET | Freq: Every day | ORAL | 1 refills | Status: DC

## 2020-09-01 NOTE — Discharge Instructions (Addendum)
All work-up done in the ED on 7 /8 was negative for anything concerning  Can try using scopolamine patch to help with dizziness, placed patch behind the ear and change every 3 days  May also try over-the-counter Dramamine to help with dizziness  Continue to stay hydrated by increasing water intake  Primary care referral made so that she may establish care, and symptoms persist you will need outpatient work-up  Can take meloxicam once a day every morning with food for shoulder pain , meloxicam please avoid ibuprofen and Aleve, can use Tylenol as needed throughout the day for additional comfort  Can use heating pad in 15 minutes for additional comfort Pain continues to persist may follow-up with orthopedic specialist for evaluation

## 2020-09-01 NOTE — ED Triage Notes (Signed)
Pt went to ED Friday because he was dizzy and had a HA. Pt left before seeing a Provider. Pt returns today to be seen.

## 2020-09-01 NOTE — ED Provider Notes (Signed)
[place]    CSN: [id number] Arrival date & time: 09/01/20  1534      History   Chief Complaint Chief Complaint  Patient presents with  . Dizziness  . Headache    HPI Matthew Montgomery is a 60 y.o. male.   Patient presents with dizziness and lightheadedness upon awakening every morning for the last 3 days.  Lays back down and attempt to resolve symptoms but symptoms are.  At the next time that he gets up.  Dizziness is worsened with movement such as sitting up or with quick head turns.  Dizziness feels like the room is spinning and that he is going to faint.  Denies syncope, headache, slurred speech, confusion.  History of diabetes, controlled, insulin Dependency resolved 1 year ago, not currently taking any medications.  Went to ED on 08/30/2020 for same symptoms, left before being seen by provider.  Past Medical History:  Diagnosis Date  . 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 Decreased visual acuity 10/01/2006  . Diabetes mellitus without complication ([place]) 02/7508     Patient Active Problem List   Diagnosis Date Noted  . Pain of molar 06/26/2016  . Heme positive stool 08/09/2015  . Dry skin dermatitis 08/09/2015  . Tinea pedis, right 08/09/2015  . Diabetes mellitus type 2, controlled ([place]) 03/08/2014  . GERD (gastroesophageal reflux disease) 03/08/2014  . Light smoker 03/08/2014    No past surgical history on file.     Home Medications    Prior to Admission medications   Medication Sig Start Date End Date Taking? Authorizing Provider  meloxicam (MOBIC) 15 MG tablet Take 1 tablet (15 mg total) by mouth daily. 09/01/20  Yes Jensyn Cambria R, NP  scopolamine (TRANSDERM-SCOP, 1.5 MG,) 1 MG/3DAYS Place 1 patch (1.5 mg total) onto the skin every 3 (three) days. 09/01/20  Yes Dayanis Bergquist, Leitha Schuller, NP  Blood Glucose Monitoring Suppl (TRUE METRIX METER) w/Device KIT Use as instructed. Check blood glucose level by fingerstick twice per day. Will pick up today. 03/27/19    Gildardo Pounds, NP  sodium chloride (OCEAN) 0.65 % SOLN nasal spray Place 1 spray into both nostrils as needed for congestion. 08/08/19   Darr, Edison Nasuti, PA-C  TRUEplus Lancets 28G MISC Use as instructed. Check blood glucose level by fingerstick twice per day. Will pick up today. 03/27/19   Gildardo Pounds, NP    Family History Family History  Problem Relation Age of Onset  . Hypertension Mother   . Diabetes Mother   . Diabetes Father   . Cancer Father     Social History Social History   Tobacco Use  . Smoking status: Every Day    Packs/day: 0.50    Pack years: 0.00    Types: Cigarettes  . Smokeless tobacco: Never  Vaping Use  . Vaping Use: Never used  Substance Use Topics  . Alcohol use: Yes    Comment: socially  . Drug use: Yes    Frequency: 2.0 times per week    Types: Marijuana, Cocaine     Allergies   Patient has no known allergies.   Review of Systems Review of Systems Deferred HPI   Physical Exam Triage Vital Signs ED Triage Vitals  Enc Vitals Group     BP 09/01/20 1608 106/70     Pulse Rate 09/01/20 1608 (!) 54     Resp 09/01/20 1608 16     Temp 09/01/20 1608 98.2 F (36.8 C)     Temp src --  SpO2 09/01/20 1608 100 %     Weight --      Height --      Head Circumference --      Peak Flow --      Pain Score 09/01/20 1610 8     Pain Loc --      Pain Edu? --      Excl. in [place]? --    Orthostatic VS for the past 24 hrs:  BP- Lying Pulse- Lying BP- Sitting Pulse- Sitting BP- Standing at 0 minutes Pulse- Standing at 0 minutes  09/01/20 1650 108/70 64 110/77 58 123/80 71    Updated Vital Signs BP 106/70   Pulse (!) 54   Temp 98.2 F (36.8 C)   Resp 16   SpO2 100%   Visual Acuity Right Eye Distance:   Left Eye Distance:   Bilateral Distance:    Right Eye Near:   Left Eye Near:    Bilateral Near:     Physical Exam Constitutional:      Appearance: He is well-developed and normal weight.  HENT:     Head: Normocephalic.     Right  Ear: Tympanic membrane, ear canal and external ear normal.     Left Ear: Tympanic membrane, ear canal and external ear normal.  Eyes:     Extraocular Movements: Extraocular movements intact.     Conjunctiva/sclera: Conjunctivae normal.     Pupils: Pupils are equal, round, and reactive to light.  Cardiovascular:     Rate and Rhythm: Normal rate and regular rhythm.     Pulses: Normal pulses.     Heart sounds: Normal heart sounds.  Pulmonary:     Effort: Pulmonary effort is normal.     Breath sounds: Normal breath sounds.  Musculoskeletal:        General: Normal range of motion.     Cervical back: Normal range of motion.  Skin:    General: Skin is warm and dry.  Neurological:     General: No focal deficit present.     Mental Status: He is alert and oriented to person, place, and time. Mental status is at baseline.     Cranial Nerves: No cranial nerve deficit.     Sensory: No sensory deficit.     Motor: No weakness.     Gait: Gait normal.  Psychiatric:        Mood and Affect: Mood normal.     UC Treatments / Results  Labs (all labs ordered are listed, but only abnormal results are displayed) Labs Reviewed  CBG MONITORING, ED - Abnormal; Notable for the following components:      Result Value   Glucose-Capillary 113 (*)    All other components within normal limits    EKG   Radiology No results found.  Procedures Procedures (including critical care time)  Medications Ordered in UC Medications - No data to display  Initial Impression / Assessment and Plan / UC Course  I have reviewed the triage vital signs and the nursing notes.  Pertinent labs & imaging results that were available during my care of the patient were reviewed by me and considered in my medical decision making (see chart for details).  Dizziness Lightheadedness Acute pain of the left shoulder  Exam unremarkable, reviewed lab and imaging completed on 08/30/2020 with patient, all testing at that time  negative, declined repeat testing 1.  Orthostatic vital signs negative for change 2.  PCP referral placed to establish care 3.  Scopolamine 1 mg / 3 days as needed, can attempt over-the-counter as needed if patch does not give relief 4.  Meloxicam 7.5 mg daily, heating pad 15 minutes 5. Ortho follow up for persistent pain   Final Clinical Impressions(s) / UC Diagnoses   Final diagnoses:  Dizziness  Lightheadedness  Acute pain of left shoulder     Discharge Instructions      All work-up done in the ED on 7 /8 was negative for anything concerning  Can try using scopolamine patch to help with dizziness, placed patch behind the ear and change every 3 days  May also try over-the-counter Dramamine to help with dizziness  Continue to stay hydrated by increasing water intake  Primary care referral made so that she may establish care, and symptoms persist you will need outpatient work-up  Can take meloxicam once a day every morning with food for shoulder pain , meloxicam please avoid ibuprofen and Aleve, can use Tylenol as needed throughout the day for additional comfort  Can use heating pad in 15 minutes for additional comfort Pain continues to persist may follow-up with orthopedic specialist for evaluation      ED Prescriptions     Medication Sig Dispense Auth. Provider   scopolamine (TRANSDERM-SCOP, 1.5 MG,) 1 MG/3DAYS Place 1 patch (1.5 mg total) onto the skin every 3 (three) days. 10 patch Amzie Sillas R, NP   meloxicam (MOBIC) 15 MG tablet Take 1 tablet (15 mg total) by mouth daily. 30 tablet Hans Eden, NP      PDMP not reviewed this encounter.   Hans Eden, [place] 09/02/20 [phone]

## 2020-09-27 ENCOUNTER — Encounter: Payer: Self-pay | Admitting: *Deleted

## 2020-10-15 ENCOUNTER — Emergency Department (HOSPITAL_BASED_OUTPATIENT_CLINIC_OR_DEPARTMENT_OTHER)
Admission: EM | Admit: 2020-10-15 | Discharge: 2020-10-15 | Disposition: A | Discharge status: Home / Self Care | Payer: 59 | Attending: Emergency Medicine | Admitting: Emergency Medicine

## 2020-10-15 ENCOUNTER — Ambulatory Visit: Payer: Self-pay | Admitting: *Deleted

## 2020-10-15 ENCOUNTER — Other Ambulatory Visit (HOSPITAL_BASED_OUTPATIENT_CLINIC_OR_DEPARTMENT_OTHER): Payer: Self-pay

## 2020-10-15 ENCOUNTER — Other Ambulatory Visit: Payer: Self-pay

## 2020-10-15 ENCOUNTER — Emergency Department (HOSPITAL_BASED_OUTPATIENT_CLINIC_OR_DEPARTMENT_OTHER): Payer: 59

## 2020-10-15 ENCOUNTER — Encounter (HOSPITAL_BASED_OUTPATIENT_CLINIC_OR_DEPARTMENT_OTHER): Payer: Self-pay

## 2020-10-15 DIAGNOSIS — E119 Type 2 diabetes mellitus without complications: Secondary | ICD-10-CM | POA: Insufficient documentation

## 2020-10-15 DIAGNOSIS — M25539 Pain in unspecified wrist: Secondary | ICD-10-CM

## 2020-10-15 DIAGNOSIS — F17210 Nicotine dependence, cigarettes, uncomplicated: Secondary | ICD-10-CM | POA: Diagnosis not present

## 2020-10-15 DIAGNOSIS — M545 Low back pain, unspecified: Secondary | ICD-10-CM | POA: Insufficient documentation

## 2020-10-15 DIAGNOSIS — M25532 Pain in left wrist: Secondary | ICD-10-CM | POA: Diagnosis not present

## 2020-10-15 MED ORDER — CYCLOBENZAPRINE HCL 10 MG PO TABS
10.0000 mg | ORAL_TABLET | Freq: Two times a day (BID) | ORAL | 0 refills | Status: DC | PRN
Start: 2020-10-15 — End: 2021-12-10
  Filled 2020-10-15: qty 20, 10d supply, fill #0

## 2020-10-15 NOTE — Discharge Instructions (Addendum)
X-ray showed no wrist fracture or malalignment.  Overall suspect overuse injury.  Recommend 1000 mg of Tylenol every 6 hours as needed for pain.  Recommend 800 mg ibuprofen every 8 hours as needed for pain.  I have written you for a muscle relaxant called Flexeril for your chronic low back pain.  Please do not mix with any other alcohol or drugs as this can make you sleepy.  No heavy machinery's while taking this medication.  Use this as needed.  Follow-up with sports medicine number provided below.

## 2020-10-15 NOTE — ED Triage Notes (Signed)
Pt c/o left wrist pain, hurts worse with grabbing items. Swelling to lateral wrist. Also c/o lumbar back time since 1992, states sometimes radiates down left leg.

## 2020-10-15 NOTE — ED Provider Notes (Signed)
[place] EMERGENCY DEPARTMENT Provider Note   CSN: [id number] Arrival date & time: 10/15/20  1141     History Chief Complaint  Patient presents with   Wrist Pain    Matthew Montgomery is a 60 y.o. male.  Matthew Montgomery is a 60 y.o. male who presents today with complaints of left wrist and left lower back pain. The patient states that the wrist pain started gradually approximately 1 week ago and has been worsening since. The pain is localized to the lateral portion of the left wrist. No acute injury noted. His employment requires regular heavy lifting which triggers pain. He has tried bracing but is unable to use due to restriction in range of motion. The patient also states that he has has lower back pain since lifting weights in 1992. He states that he was evaluated then and told that he had a slipped disc in his lumbar spine, but refused treatment for same. States that pain has been constant since this time, and is only relieved by bracing. The pain radiates to the left side and down his right thigh.  The history is provided by the patient. No language interpreter was used.  Wrist Pain This is a new problem. The problem has been gradually worsening. The symptoms are aggravated by bending and twisting. The symptoms are relieved by rest.      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 No pertinent surgical history.     Family History  Problem Relation Age of Onset   Hypertension Mother    Diabetes Mother     Diabetes Father    Cancer Father     Social History   Tobacco Use   Smoking status: Every Day    Packs/day: 0.50    Types: Cigarettes   Smokeless tobacco: Never  Vaping Use   Vaping Use: Never used  Substance Use Topics   Alcohol use: Yes    Comment: socially   Drug use: Yes    Types: Marijuana    Home Medications Prior to Admission medications   Medication Sig Start Date End Date Taking? Authorizing Provider  Blood Glucose Monitoring Suppl (TRUE METRIX METER) w/Device KIT Use as instructed. Check blood glucose level by fingerstick twice per day. Will pick up today. 03/27/19   Gildardo Pounds, NP  meloxicam (MOBIC) 15 MG tablet Take 1 tablet (15 mg total) by mouth daily. 09/01/20   White, Leitha Schuller, NP  scopolamine (TRANSDERM-SCOP, 1.5 MG,) 1 MG/3DAYS Place 1 patch (1.5 mg total) onto the skin every 3 (three) days. 09/01/20   White, Leitha Schuller, NP  sodium chloride (OCEAN) 0.65 % SOLN nasal spray Place 1 spray into both nostrils as needed for congestion. 08/08/19   Darr, Edison Nasuti, PA-C  TRUEplus Lancets 28G MISC Use as instructed. Check blood glucose level by fingerstick twice per day. Will pick up today. 03/27/19   Gildardo Pounds, NP    Allergies    Patient has no known allergies.  Review of Systems   Review of Systems  Musculoskeletal:  Positive for back pain, gait problem and joint swelling.  Skin:  Negative for color change.  All other systems reviewed and are negative.  Physical Exam Updated Vital Signs BP 118/85 (BP Location: Right Arm)   Pulse 60   Temp 97.8 F (36.6 C) (Oral)   Resp 18   Ht '5\' 9"'  (1.753 m)   Wt 79.4 kg   SpO2 99%   BMI 25.84 kg/m   Physical Exam Constitutional:      General: He is not in acute distress.    Appearance: Normal appearance. He is normal weight. He is not ill-appearing, toxic-appearing or diaphoretic.  Musculoskeletal:     Left wrist: Swelling, tenderness and bony tenderness present. No deformity, lacerations, snuff box tenderness  or crepitus. Decreased range of motion. Normal pulse.     Lumbar back: Tenderness present. No swelling, edema, deformity, signs of trauma, lacerations, spasms or bony tenderness. Decreased range of motion. Negative left straight leg raise test. No scoliosis.       Back:     Comments: No midline tenderness, localized to the left.    ED Results / Procedures / Treatments   Labs (all labs ordered are listed, but only abnormal results are displayed) Labs Reviewed - No data to display  EKG None  Radiology No results found.  Procedures Procedures   Medications Ordered in ED Medications - No data to display  ED Course  I have reviewed the triage vital signs and the nursing notes.  Pertinent labs & imaging results that were available during my care of the patient were reviewed by me and considered in my medical decision making (see chart for details).    MDM Rules/Calculators/A&P                         Patient is a 60 y.o. male who came in complaining of wrist and back pain. Wrist imagining was unremarkable, and joint was not warm or erythematous. These findings reduce concern for acute injury, septic arthritis, or pseudogout. Back pain has been consistent for 30 years, with no spinal tenderness, fever, or chills. No concern for epidural abscess, cauda equina, or acute injury. Will follow up with sports medicine, start Flexeril, recommend OTC tylenol and ibuprofen with ice and immobilization.   Final Clinical Impression(s) / ED Diagnoses Final diagnoses:  Pain in wrist, unspecified laterality    Rx / DC Orders ED Discharge Orders          Ordered    cyclobenzaprine (FLEXERIL) 10 MG tablet  2 times daily PRN        10/15/20 1318          An After Visit Summary was printed and given to the patient.     Bud Face, PA 10/15/20 [place], [place], DO 10/15/20 1354

## 2020-10-15 NOTE — ED Provider Notes (Signed)
I personally evaluated the patient during the encounter and completed a history, physical, procedures, medical decision making to contribute to the overall care of the patient and decision making for the patient briefly, the patient is a 60 y.o. male here with left wrist pain..  Normal vitals.  No fever.  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  Has been having some acute on chronic back pain as well.  No cauda equina symptoms.  X-ray of the left wrist unremarkable.  No concern for septic joint or pseudogout.  Overall suspect arthritis/overuse.  Recommend Tylenol, ibuprofen.  Will prescribe Flexeril for likely back spasm.  Will refer to sports medicine follow-up if needed.  Discharged in good condition.  This chart was dictated using voice recognition software.  Despite best efforts to proofread,  errors can occur which can change the documentation meaning.    EKG Interpretation None            Virgina Norfolk, DO 10/15/20 1323

## 2020-10-15 NOTE — Telephone Encounter (Signed)
Patient called to report he is still having rectal bleeding- on tissue in the mornings- not every day- ongoing for at least 1 year. Patient also complained of wrist pain- severe- had to miss work today. Triaged for both symptoms- appointment made for rectal bleeding and advised UC for wrist pain (no open appointment-did advise mobile unit)

## 2020-10-15 NOTE — Telephone Encounter (Signed)
Reason for Disposition  [1] Rectal bleeding is minimal (e.g., blood just on toilet paper, few drops, streaks on surface of normal formed BM) AND [2] bleeding recurs 3 or more times on treatment  [1] SEVERE pain (e.g., excruciating, unable to use hand at all) AND [2] not improved after 2 hours of pain medicine  Answer Assessment - Initial Assessment Questions 1. APPEARANCE of BLOOD: "What color is it?" "Is it passed separately, on the surface of the stool, or mixed in with the stool?"      When wipes only- red 2. AMOUNT: "How much blood was passed?"      Not much- small amount 3. FREQUENCY: "How many times has blood been passed with the stools?"      Not every time 4. ONSET: "When was the blood first seen in the stools?" (Days or weeks)      At least 1 year 5. DIARRHEA: "Is there also some diarrhea?" If Yes, ask: "How many diarrhea stools in the past 24 hours?"      no 6. CONSTIPATION: "Do you have constipation?" If Yes, ask: "How bad is it?"     Sometimes first thing in the morning 7. RECURRENT SYMPTOMS: "Have you had blood in your stools before?" If Yes, ask: "When was the last time?" and "What happened that time?"      Yes- did stool kit- was negative 8. BLOOD THINNERS: "Do you take any blood thinners?" (e.g., Coumadin/warfarin, Pradaxa/dabigatran, aspirin)     no 9. OTHER SYMPTOMS: "Do you have any other symptoms?"  (e.g., abdomen pain, vomiting, dizziness, fever)     no 10. PREGNANCY: "Is there any chance you are pregnant?" "When was your last menstrual period?"       N/a  Answer Assessment - Initial Assessment Questions 1. ONSET: "When did the pain start?"     2 weeks 2. LOCATION: "Where is the pain located?"     L wrist- outside of wrist- swelling 3. PAIN: "How bad is the pain?" (Scale 1-10; or mild, moderate, severe)   - MILD (1-3): doesn't interfere with normal activities   - MODERATE (4-7): interferes with normal activities (e.g., work or school) or awakens from sleep   -  SEVERE (8-10): excruciating pain, unable to use hand at all     Moderate/severe 4. WORK OR EXERCISE: "Has there been any recent work or exercise that involved this part (i.e., hand or wrist) of the body?"     Could have happened at work 5. CAUSE: "What do you think is causing the pain?"     unsure 6. AGGRAVATING FACTORS: "What makes the pain worse?" (e.g., using computer)     Moving it- using it 7. OTHER SYMPTOMS: "Do you have any other symptoms?" (e.g., neck pain, swelling, rash, numbness, fever)     Pain , swelling sometimes finger numbness 8. PREGNANCY: "Is there any chance you are pregnant?" "When was your last menstrual period?"     N/a  Protocols used: Rectal Bleeding-A-AH, Hand and Wrist Pain-A-AH

## 2020-10-25 ENCOUNTER — Ambulatory Visit: Payer: 59 | Admitting: Family Medicine

## 2020-10-25 NOTE — Progress Notes (Deleted)
  Matthew Montgomery - 60 y.o. male MRN [id number]  Date of birth: Sep 30, 1960  SUBJECTIVE:  Including CC & ROS.  No chief complaint on file.   Matthew Montgomery is a 60 y.o. male that is  ***.  ***   Review of Systems See HPI   HISTORY: Past Medical, Surgical, Social, and Family History Reviewed & Updated per EMR.   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  Family History  Problem Relation Age of Onset   Hypertension Mother    Diabetes Mother    Diabetes Father    Cancer Father     Social History   Socioeconomic History   Marital status: Single    Spouse name: Not on file   Number of children: Not on file   Years of education: Not on file   Highest education level: Not on file  Occupational History   Not on file  Tobacco Use   Smoking status: Every Day    Packs/day: 0.50    Types: Cigarettes   Smokeless tobacco: Never  Vaping Use   Vaping Use: Never used  Substance and Sexual Activity   Alcohol use: Yes    Comment: socially   Drug use: Yes    Types: Marijuana   Sexual activity: Yes  Other Topics Concern   Not on file  Social History Narrative   Not on file   Social Determinants of Health   Financial Resource Strain: Not on file  Food Insecurity: Not on file  Transportation Needs: Not on file  Physical Activity: Not on file  Stress: Not on file  Social Connections: Not on file  Intimate Partner Violence: Not on file     PHYSICAL EXAM:  VS: There were no vitals taken for this visit. Physical Exam Gen: NAD, alert, cooperative with exam, well-appearing MSK:  ***      ASSESSMENT & PLAN:   No problem-specific Assessment & Plan notes found for this encounter.

## 2020-10-30 ENCOUNTER — Ambulatory Visit: Payer: 59 | Admitting: Physician Assistant

## 2020-10-30 NOTE — Progress Notes (Deleted)
Patient ID: Matthew Montgomery, male   DOB: November 19, 1960, 60 y.o.   MRN: [id number]   Seen at ED 10/15/2020.  From ED A/P: Patient is a 60 y.o. male who came in complaining of wrist and back pain. Wrist imagining was unremarkable, and joint was not warm or erythematous. These findings reduce concern for acute injury, septic arthritis, or pseudogout. Back pain has been consistent for 30 years, with no spinal tenderness, fever, or chills. No concern for epidural abscess, cauda equina, or acute injury. Will follow up with sports medicine, start Flexeril, recommend OTC tylenol and ibuprofen with ice and immobilization.

## 2020-10-31 ENCOUNTER — Encounter: Payer: Self-pay | Admitting: Nurse Practitioner

## 2020-10-31 ENCOUNTER — Ambulatory Visit: Payer: 59 | Admitting: Family Medicine

## 2020-10-31 ENCOUNTER — Telehealth (INDEPENDENT_AMBULATORY_CARE_PROVIDER_SITE_OTHER): Payer: 59 | Admitting: Nurse Practitioner

## 2020-10-31 ENCOUNTER — Other Ambulatory Visit: Payer: Self-pay

## 2020-10-31 DIAGNOSIS — K625 Hemorrhage of anus and rectum: Secondary | ICD-10-CM

## 2020-10-31 DIAGNOSIS — K5900 Constipation, unspecified: Secondary | ICD-10-CM | POA: Diagnosis not present

## 2020-10-31 MED ORDER — DOCUSATE SODIUM 100 MG PO CAPS: 100.0000 mg | ORAL_CAPSULE | Freq: Two times a day (BID) | ORAL | 0 refills | Status: DC

## 2020-10-31 NOTE — Patient Instructions (Signed)
Constipation Rectal bleeding:  Will place referral to GI  Stay active  Stay well hydrated  Will order stool softener   Follow up:  Follow up if needed  Constipation, Adult Constipation is when a person has trouble pooping (having a bowel movement). When you have this condition, you may poop fewer than 3 times a week. Your poop (stool) may also be dry, hard, or bigger than normal. Follow these instructions at home: Eating and drinking  Eat foods that have a lot of fiber, such as: Fresh fruits and vegetables. Whole grains. Beans. Eat less of foods that are low in fiber and high in fat and sugar, such as: [place] fries. Hamburgers. Cookies. Candy. Soda. Drink enough fluid to keep your pee (urine) pale yellow. General instructions Exercise regularly or as told by your doctor. Try to do 150 minutes of exercise each week. Go to the restroom when you feel like you need to poop. Do not hold it in. Take over-the-counter and prescription medicines only as told by your doctor. These include any fiber supplements. When you poop: Do deep breathing while relaxing your lower belly (abdomen). Relax your pelvic floor. The pelvic floor is a group of muscles that support the rectum, bladder, and intestines (as well as the uterus in women). Watch your condition for any changes. Tell your doctor if you notice any. Keep all follow-up visits as told by your doctor. This is important. Contact a doctor if: You have pain that gets worse. You have a fever. You have not pooped for 4 days. You vomit. You are not hungry. You lose weight. You are bleeding from the opening of the butt (anus). You have thin, pencil-like poop. Get help right away if: You have a fever, and your symptoms suddenly get worse. You leak poop or have blood in your poop. Your belly feels hard or bigger than normal (bloated). You have very bad belly pain. You feel dizzy or you faint. Summary Constipation is when a person  poops fewer than 3 times a week, has trouble pooping, or has poop that is dry, hard, or bigger than normal. Eat foods that have a lot of fiber. Drink enough fluid to keep your pee (urine) pale yellow. Take over-the-counter and prescription medicines only as told by your doctor. These include any fiber supplements. This information is not intended to replace advice given to you by your health care provider. Make sure you discuss any questions you have with your health care provider. Document Revised: 12/28/2018 Document Reviewed: 12/28/2018 Elsevier Patient Education  2022 [employer] Rectal Bleeding Rectal bleeding is when blood comes out of the opening of the butt (anus). People with this kind of bleeding may notice bright red blood in their underwear or in the toilet after they poop (have a bowel movement). They may also have blood mixed with their poop (stool), or dark red or black poop. Rectal bleeding is often a sign that something is wrong. This condition can be caused by many things. It needs to be checked by a doctor. Your doctor will do tests to know what is causing your condition. Follow these instructions at home: Watch for any changes in your condition. Take these actions to help with bleeding and discomfort: Medicines Take over-the-counter and prescription medicines only as told by your doctor. Ask your doctor about changing or stopping your normal medicines. This is important if you are taking blood thinners. Medicines that thin the blood can make rectal bleeding worse. Managing constipation Your condition  may cause trouble pooping (constipation). To prevent or treat trouble pooping, or to help make your poop soft, you may need to: Drink enough fluid to keep your pee (urine) pale yellow. Take over-the-counter or prescription medicines. Eat foods that are high in fiber. These include beans, whole grains, and fresh fruits and vegetables. Limit foods that are high in fat and sugar.  These include fried or sweet foods.  General instructions Try not to strain when you poop. Try taking a warm bath. This may help with pain. Keep all follow-up visits as told by your doctor. This is important. Contact a doctor if: You have pain or swelling in your belly (abdomen). You have a fever. You feel weak. You feel like you may vomit. You cannot poop. Get help right away if: You have new bleeding. You have more bleeding than before. You have black or dark red poop. You vomit blood or something that looks like coffee grounds. You pass out (faint). You have very bad pain in your butt. Summary Rectal bleeding is when blood comes out of the opening of the butt. This bleeding is often a sign that something is wrong. Eat a diet that is high in fiber. This will help to keep your poop soft. Talk to your doctor if you take medicines that thin the blood. These medicines can make bleeding worse. Get help right away if you have new or more bleeding, black or dark red poop, or blood in your vomit. Also, get help if you pass out or have very bad pain in your butt. This information is not intended to replace advice given to you by your health care provider. Make sure you discuss any questions you have with your health care provider. Document Revised: 01/11/2019 Document Reviewed: 01/11/2019 Elsevier Patient Education  2022 [employer].

## 2020-10-31 NOTE — Progress Notes (Signed)
Virtual Visit via Telephone Note  I connected with Prophet Renwick on 10/31/20 at  8:50 AM EDT by telephone and verified that I am speaking with the correct person using two identifiers.  Location: Patient: home Provider: office   I discussed the limitations, risks, security and privacy concerns of performing an evaluation and management service by telephone and the availability of in person appointments. I also discussed with the patient that there may be a patient responsible charge related to this service. The patient expressed understanding and agreed to proceed.   History of Present Illness:  Patient presents today for televisit for rectal bleeding.  Patient states that over the past couple weeks he has been having issues with constipation.  He has been noticing bright red blood when he goes to the bathroom to have a bowel movement.  He states that this issue has been ongoing for several months.  Patient states that he did have a colonoscopy that was clear 3 years ago.  He has not followed up with GI since that time.  We discussed that we can start him on a stool softener and place a referral to GI for further evaluation. Denies f/c/s, n/v/d, hemoptysis, PND, chest pain or edema.    Observations/Objective:  Vitals with BMI 10/15/2020 09/01/2020 08/30/2020  Height 5\' 9"  - -  Weight 175 lbs - -  BMI 25.83 - -  Systolic 118 106  Diastolic 85 70 75  Pulse 60 54 52      Assessment and Plan:  Constipation Rectal bleeding:  Will place referral to GI  Stay active  Stay well hydrated  Will order stool softener   Follow up:  Follow up if needed  Patient Instructions  Constipation Rectal bleeding:  Will place referral to GI  Stay active  Stay well hydrated  Will order stool softener   Follow up:  Follow up if needed  Constipation, Adult Constipation is when a person has trouble pooping (having a bowel movement). When you have this condition, you may  poop fewer than 3 times a week. Your poop (stool) may also be dry, hard, or bigger than normal. Follow these instructions at home: Eating and drinking  Eat foods that have a lot of fiber, such as: Fresh fruits and vegetables. Whole grains. Beans. Eat less of foods that are low in fiber and high in fat and sugar, such as: 481 fries. Hamburgers. Cookies. Candy. Soda. Drink enough fluid to keep your pee (urine) pale yellow. General instructions Exercise regularly or as told by your doctor. Try to do 150 minutes of exercise each week. Go to the restroom when you feel like you need to poop. Do not hold it in. Take over-the-counter and prescription medicines only as told by your doctor. These include any fiber supplements. When you poop: Do deep breathing while relaxing your lower belly (abdomen). Relax your pelvic floor. The pelvic floor is a group of muscles that support the rectum, bladder, and intestines (as well as the uterus in women). Watch your condition for any changes. Tell your doctor if you notice any. Keep all follow-up visits as told by your doctor. This is important. Contact a doctor if: You have pain that gets worse. You have a fever. You have not pooped for 4 days. You vomit. You are not hungry. You lose weight. You are bleeding from the opening of the butt (anus). You have thin, pencil-like poop. Get help right away if: You have a fever, and your symptoms  suddenly get worse. You leak poop or have blood in your poop. Your belly feels hard or bigger than normal (bloated). You have very bad belly pain. You feel dizzy or you faint. Summary Constipation is when a person poops fewer than 3 times a week, has trouble pooping, or has poop that is dry, hard, or bigger than normal. Eat foods that have a lot of fiber. Drink enough fluid to keep your pee (urine) pale yellow. Take over-the-counter and prescription medicines only as told by your doctor. These include any  fiber supplements. This information is not intended to replace advice given to you by your health care provider. Make sure you discuss any questions you have with your health care provider. Document Revised: 12/28/2018 Document Reviewed: 12/28/2018 Elsevier Patient Education  2022 Elsevier Inc. Rectal Bleeding Rectal bleeding is when blood comes out of the opening of the butt (anus). People with this kind of bleeding may notice bright red blood in their underwear or in the toilet after they poop (have a bowel movement). They may also have blood mixed with their poop (stool), or dark red or black poop. Rectal bleeding is often a sign that something is wrong. This condition can be caused by many things. It needs to be checked by a doctor. Your doctor will do tests to know what is causing your condition. Follow these instructions at home: Watch for any changes in your condition. Take these actions to help with bleeding and discomfort: Medicines Take over-the-counter and prescription medicines only as told by your doctor. Ask your doctor about changing or stopping your normal medicines. This is important if you are taking blood thinners. Medicines that thin the blood can make rectal bleeding worse. Managing constipation Your condition may cause trouble pooping (constipation). To prevent or treat trouble pooping, or to help make your poop soft, you may need to: Drink enough fluid to keep your pee (urine) pale yellow. Take over-the-counter or prescription medicines. Eat foods that are high in fiber. These include beans, whole grains, and fresh fruits and vegetables. Limit foods that are high in fat and sugar. These include fried or sweet foods.  General instructions Try not to strain when you poop. Try taking a warm bath. This may help with pain. Keep all follow-up visits as told by your doctor. This is important. Contact a doctor if: You have pain or swelling in your belly (abdomen). You have a  fever. You feel weak. You feel like you may vomit. You cannot poop. Get help right away if: You have new bleeding. You have more bleeding than before. You have black or dark red poop. You vomit blood or something that looks like coffee grounds. You pass out (faint). You have very bad pain in your butt. Summary Rectal bleeding is when blood comes out of the opening of the butt. This bleeding is often a sign that something is wrong. Eat a diet that is high in fiber. This will help to keep your poop soft. Talk to your doctor if you take medicines that thin the blood. These medicines can make bleeding worse. Get help right away if you have new or more bleeding, black or dark red poop, or blood in your vomit. Also, get help if you pass out or have very bad pain in your butt. This information is not intended to replace advice given to you by your health care provider. Make sure you discuss any questions you have with your health care provider. Document Revised: 01/11/2019 Document  Reviewed: 01/11/2019 Elsevier Patient Education  2022 Elsevier Inc.     I discussed the assessment and treatment plan with the patient. The patient was provided an opportunity to ask questions and all were answered. The patient agreed with the plan and demonstrated an understanding of the instructions.   The patient was advised to call back or seek an in-person evaluation if the symptoms worsen or if the condition fails to improve as anticipated.  I provided 23 minutes of non-face-to-face time during this encounter.   Ivonne Andrew, NP

## 2020-10-31 NOTE — Progress Notes (Deleted)
  Matthew Montgomery - 60 y.o. male MRN [id number]  Date of birth: 01/24/1961  SUBJECTIVE:  Including CC & ROS.  No chief complaint on file.   Matthew Montgomery is a 60 y.o. male that is  ***.  ***   Review of Systems See HPI   HISTORY: Past Medical, Surgical, Social, and Family History Reviewed & Updated per EMR.   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  Family History  Problem Relation Age of Onset   Hypertension Mother    Diabetes Mother    Diabetes Father    Cancer Father     Social History   Socioeconomic History   Marital status: Single    Spouse name: Not on file   Number of children: Not on file   Years of education: Not on file   Highest education level: Not on file  Occupational History   Not on file  Tobacco Use   Smoking status: Every Day    Packs/day: 0.50    Types: Cigarettes   Smokeless tobacco: Never  Vaping Use   Vaping Use: Never used  Substance and Sexual Activity   Alcohol use: Yes    Comment: socially   Drug use: Yes    Types: Marijuana   Sexual activity: Yes  Other Topics Concern   Not on file  Social History Narrative   Not on file   Social Determinants of Health   Financial Resource Strain: Not on file  Food Insecurity: Not on file  Transportation Needs: Not on file  Physical Activity: Not on file  Stress: Not on file  Social Connections: Not on file  Intimate Partner Violence: Not on file     PHYSICAL EXAM:  VS: There were no vitals taken for this visit. Physical Exam Gen: NAD, alert, cooperative with exam, well-appearing MSK:  ***      ASSESSMENT & PLAN:   No problem-specific Assessment & Plan notes found for this encounter.     

## 2020-11-04 ENCOUNTER — Ambulatory Visit (INDEPENDENT_AMBULATORY_CARE_PROVIDER_SITE_OTHER): Payer: 59 | Admitting: Family Medicine

## 2020-11-04 ENCOUNTER — Ambulatory Visit (HOSPITAL_BASED_OUTPATIENT_CLINIC_OR_DEPARTMENT_OTHER)
Admission: RE | Admit: 2020-11-04 | Discharge: 2020-11-04 | Disposition: A | Discharge status: Home / Self Care | Payer: 59 | Source: Ambulatory Visit | Attending: Family Medicine | Admitting: Family Medicine

## 2020-11-04 ENCOUNTER — Encounter: Payer: Self-pay | Admitting: Family Medicine

## 2020-11-04 ENCOUNTER — Ambulatory Visit: Payer: Self-pay

## 2020-11-04 ENCOUNTER — Other Ambulatory Visit: Payer: Self-pay

## 2020-11-04 VITALS — Ht 69.0 in | Wt 175.0 lb

## 2020-11-04 DIAGNOSIS — M778 Other enthesopathies, not elsewhere classified: Secondary | ICD-10-CM | POA: Diagnosis not present

## 2020-11-04 DIAGNOSIS — S39012A Strain of muscle, fascia and tendon of lower back, initial encounter: Secondary | ICD-10-CM

## 2020-11-04 DIAGNOSIS — M25532 Pain in left wrist: Secondary | ICD-10-CM

## 2020-11-04 MED ORDER — PREDNISONE 5 MG PO TABS: ORAL_TABLET | ORAL | 0 refills | Status: DC

## 2020-11-04 NOTE — Patient Instructions (Signed)
Nice to meet you  Please try ice  Please try the exercises  I will call with the xray results.  Please send me a message in MyChart with any questions or updates.  Please see me back in 4 weeks.   --Dr. Daymeon Fischman  

## 2020-11-04 NOTE — Assessment & Plan Note (Signed)
Has changes at the 6 dorsal compartment consistent with tendinitis as he does repetitive movements at work. -Counseled on home exercise therapy and supportive care. -Prednisone. -Counseled on compression. -Provided work note. -Could consider injection.

## 2020-11-04 NOTE — Assessment & Plan Note (Signed)
Acute on chronic in nature.  Reports having a long history of left-sided low back pain with intermittent radicular pain. -Counseled on home exercise therapy and supportive care. -X-ray. -Consider physical therapy.

## 2020-11-04 NOTE — Progress Notes (Signed)
  Matthew Montgomery - 60 y.o. male MRN [id number]  Date of birth: 28-May-1960  SUBJECTIVE:  Including CC & ROS.  No chief complaint on file.   Matthew Montgomery is a 60 y.o. male that is presenting with left wrist pain and low back pain..  Independent review of the left wrist x-ray from 8/23 shows no changes.   Review of Systems See HPI   HISTORY: Past Medical, Surgical, Social, and Family History Reviewed & Updated per EMR.   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 No pertinent surgical history.  Family History  Problem Relation Age of Onset   Hypertension Mother    Diabetes Mother    Diabetes Father    Cancer Father     Social History   Socioeconomic History   Marital status: Single    Spouse name: Not on file   Number of children: Not on file   Years of education: Not on file   Highest education level: Not on file  Occupational History   Not on file  Tobacco Use   Smoking status: Every Day    Packs/day: 0.50    Types: Cigarettes   Smokeless tobacco: Never  Vaping Use   Vaping Use: Never used  Substance and Sexual Activity   Alcohol use: Yes    Comment: socially   Drug use: Yes    Types: Marijuana   Sexual activity: Yes  Other Topics Concern   Not on file  Social History Narrative   Not on file   Social Determinants of Health   Financial Resource Strain: Not on file  Food Insecurity: Not on file  Transportation Needs: Not on file  Physical Activity: Not on file  Stress: Not on file  Social Connections: Not on file  Intimate Partner Violence: Not on file     PHYSICAL EXAM:  VS: Ht 5\' 9"  (1.753 m)   Wt 175 lb (79.4 kg)   BMI 25.84 kg/m  Physical Exam Gen: NAD, alert, cooperative with exam, well-appearing   Limited ultrasound: Left wrist:  No evidence of  effusion within the wrist joints. Encircling effusion of the ECU as it courses over the ulnar styloid.  No hyperemia associated.  Summary: Findings consistent with ECU tendinitis  Ultrasound and interpretation by , MD     ASSESSMENT & PLAN:   Extensor carpi ulnaris tendinitis Has changes at the 6 dorsal compartment consistent with tendinitis as he does repetitive movements at work. -Counseled on home exercise therapy and supportive care. -Prednisone. -Counseled on compression. -Provided work note. -Could consider injection.  Lumbar strain, initial encounter Acute on chronic in nature.  Reports having a long history of left-sided low back pain with intermittent radicular pain. -Counseled on home exercise therapy and supportive care. -X-ray. -Consider physical therapy.

## 2020-11-05 ENCOUNTER — Telehealth: Payer: Self-pay | Admitting: Family Medicine

## 2020-11-05 DIAGNOSIS — S39012A Strain of muscle, fascia and tendon of lower back, initial encounter: Secondary | ICD-10-CM

## 2020-11-05 NOTE — Telephone Encounter (Signed)
Informed of results. Will place referral to PT.   Myra Rude, MD [facility] 11/05/2020, 4:26 PM

## 2020-11-11 ENCOUNTER — Telehealth: Payer: Self-pay | Admitting: Family Medicine

## 2020-11-11 NOTE — Telephone Encounter (Signed)
Patient cld says Employer has no lite duty for him & has taken him out of work until cleared to return.   ---Pt request provider write RTW note returning him to full duty w/ No restrictions.,he will access letter via Mychart.   --glh

## 2020-11-13 NOTE — Telephone Encounter (Signed)
See letter in letter tab.

## 2020-11-19 ENCOUNTER — Other Ambulatory Visit: Payer: Self-pay

## 2020-11-19 ENCOUNTER — Ambulatory Visit: Payer: 59 | Attending: Family Medicine | Admitting: Physical Therapy

## 2020-11-19 ENCOUNTER — Encounter: Payer: Self-pay | Admitting: Physical Therapy

## 2020-11-19 DIAGNOSIS — G8929 Other chronic pain: Secondary | ICD-10-CM | POA: Diagnosis present

## 2020-11-19 DIAGNOSIS — M6289 Other specified disorders of muscle: Secondary | ICD-10-CM | POA: Diagnosis present

## 2020-11-19 DIAGNOSIS — M545 Low back pain, unspecified: Secondary | ICD-10-CM | POA: Diagnosis not present

## 2020-11-19 DIAGNOSIS — M6281 Muscle weakness (generalized): Secondary | ICD-10-CM | POA: Diagnosis present

## 2020-11-19 NOTE — Therapy (Signed)
[facility] [address] 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 No pertinent surgical history.  There were no vitals filed for this visit.    Subjective Assessment - 11/19/20 1513     Subjective [place] Jeff Crossan is a 60 y.o. male who presents to clinic with chief complaint of low back pain.  MOI/History of condition: History of low back pain since a deadlifting injury in the late 90's d/t a disk rupture.  Pain location: L>R low back pain with radiation over anterior and posterior thighs.  Uses lumbar belt at work and then when he opens it his back is very painful.  Red flags: blood in stool (bright red) for 2 weeks (going to MD for referral for colonoscopy), does not feel this is related to LBP.  Deneis N/T in legs.  48 hour pain intensity:  highest 9/10, current 7/10, best 4-5/10.  Aggs: Lifting, work, movement.  Eases: rest (sitting).  Nature: aching, sharp, dull.  Severity: high.  Irritability: mod.  Stage: chronic.  Stability: slowly getting worse.  24 hour pattern: worse in morning, improves with gentle movement, worse with prolonged activity.  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  Functional limitations/goals: find problem to low back pain.  Home  environment: lives alone, 4-5 STE.  Assistive device: none.   Hand dominance: R.  Falls: none.    Pertinent History Significant PMH: DM TII                [facility] PT Assessment - 11/19/20 0001       Assessment   Medical Diagnosis Referral diagnosis: Lumbar strain, initial encounter (Z36.644I)    Referring Provider (PT) Myra Rude, MD    Onset Date/Surgical Date 11/19/00    Hand Dominance Right      Precautions   Precaution Comments Significant PMH: DM TII      Restrictions   Weight Bearing Restrictions No      Balance Screen   Has the patient fallen in the past 6 months No      Prior Function   Level of Independence Independent      Observation/Other Assessments   Observations loss of lumbar lordosis, forward flexed posture    Focus on Therapeutic Outcomes (FOTO)  next visit      Sensation   Light Touch Appears Intact      Functional Tests   Functional tests Sit to Stand;Single leg stance;Other;Other2      Sit to Stand   Comments 30'' STS: 3x      Other:   Other/ Comments Sustained supine bridge (dominant leg extended at 120'', if reached): 5'' (partial ROM) (norm 170'')  ROM / Strength   AROM / PROM / Strength AROM;Strength      AROM   AROM Assessment Site Lumbar    Lumbar Flexion 32 cm P!    Lumbar Extension limited by 75% P!    Lumbar - Right Side Bend limited 75% w/ P!    Lumbar - Left Side Bend limited 75% w/ P!    Lumbar - Right Rotation limited 25% w/ P!    Lumbar - Left Rotation limited 50% w/ P!      Flexibility   Soft Tissue Assessment /Muscle Length yes    Hamstrings restricted L>R      Palpation   Palpation comment TTP lumbar paraspinals with increased bulk      Special Tests   Other special tests slump (+) tension on L                        Objective measurements completed on examination: See above findings.                PT Education - 11/19/20 1514     Education Details POC, diagnosis,  prognosis, HEP.  Pt educated via explanation, demonstration, and handout (HEP).  Pt confirms understanding verbally.              PT Short Term Goals - 11/19/20 1518       PT SHORT TERM GOAL #1   Title Gordy Savers Okerlund will be >75% HEP compliant to improve carryover between sessions and facilitate independent management of condition    Target Date 12/10/20               PT Long Term Goals - 11/19/20 1518       PT LONG TERM GOAL #1   Title Gordy Savers Lacek will be able to maintain supine bridge for 30'' (dominant leg extended if 120'' reached) as evidence of improved hip extension and core strength (norm for healthy adult ~170'')  EVAL: unable to move through full ROM  target date: 01/14/2021      PT LONG TERM GOAL #2   Title Gordy Savers Sames will improve 30'' STS (MCID 2) to >/= 6x to show improved LE strength and improved transfers  EVAL: 3x   target date: 01/14/21      PT LONG TERM GOAL #3   Title Gordy Savers Kundinger will report >/= 50% decrease in pain from evaluation  EVAL: 10/10 max pain  target date: 01/14/21      PT LONG TERM GOAL #4   Title FOTO goal                    Plan - 11/19/20 1516     Clinical Impression Statement Breyden Jeudy Amory is a 60 y.o. male who presents to clinic with signs and sxs consistent with LBP secondary to degenerative changes and possible L5-S1 nerve root irritation secondary to chronic disc injury.  Pt reports fresh blood in stool.  He is going to an MD to get this checked out as soon as possible (onset 2 weeks ago).  Pt presents with pain and impairments/deficits in: hip extensor strength, core strength, hip abduction strength, dural tension.  Activity limitations include: lifting, standing, walking.  Participation limitations include: yardwork, work, heavy household chores.  Pt will benefit from skilled therapy to address pain and the listed deficits in order to achieve functional goals,  enable safety and independence in completion of daily tasks, and return  to PLOF.    Stability/Clinical Decision Making Stable/Uncomplicated    Clinical Decision Making Low    Rehab Potential Fair    PT Frequency 1x / week    PT Duration 8 weeks    PT Treatment/Interventions ADLs/Self Care Home Management;Aquatic Therapy;Iontophoresis 4mg /ml Dexamethasone;Therapeutic activities;Therapeutic exercise;Neuromuscular re-education;Manual techniques;Dry needling;Vasopneumatic Device;Spinal Manipulations;Joint Manipulations    PT Next Visit Plan Take foto and establish goal, progressive core, look at hip abduction strength and hip flexor length    PT Home Exercise Plan    Recommended Other Services Get to MD as soon as possible to identify cause of blood in stool; pt confirms understanding    Consulted and Agree with Plan of Care Patient             Patient will benefit from skilled therapeutic intervention in order to improve the following deficits and impairments:  Abnormal gait, Difficulty walking, Decreased activity tolerance, Decreased strength, Pain  Visit Diagnosis: Chronic low back pain, unspecified back pain laterality, unspecified whether sciatica present  Muscle weakness  Muscle tightness     Problem List Patient Active Problem List   Diagnosis Date Noted   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

## 2020-11-19 NOTE — Patient Instructions (Signed)
Access Code: [email or web address] URL: [email or web address] Date: 11/19/2020 Prepared by: Alphonzo Severance  Exercises Supine Posterior Pelvic Tilt - 2 x daily - 7 x weekly - 2 sets - 10 reps - 5'' hold Seated Sciatic Tensioner - 1 x daily - 7 x weekly - 3 sets - 10 reps

## 2020-11-26 ENCOUNTER — Ambulatory Visit: Payer: 59 | Attending: Family Medicine | Admitting: Physical Therapy

## 2020-12-02 ENCOUNTER — Ambulatory Visit: Payer: 59 | Admitting: Family Medicine

## 2020-12-02 NOTE — Progress Notes (Deleted)
  Matthew Montgomery - 60 y.o. male MRN [id number]  Date of birth: 12/08/1960  SUBJECTIVE:  Including CC & ROS.  No chief complaint on file.   Matthew Montgomery is a 60 y.o. male that is  ***.  ***   Review of Systems See HPI   HISTORY: Past Medical, Surgical, Social, and Family History Reviewed & Updated per EMR.   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  Family History  Problem Relation Age of Onset   Hypertension Mother    Diabetes Mother    Diabetes Father    Cancer Father     Social History   Socioeconomic History   Marital status: Single    Spouse name: Not on file   Number of children: Not on file   Years of education: Not on file   Highest education level: Not on file  Occupational History   Not on file  Tobacco Use   Smoking status: Every Day    Packs/day: 0.50    Types: Cigarettes   Smokeless tobacco: Never  Vaping Use   Vaping Use: Never used  Substance and Sexual Activity   Alcohol use: Yes    Comment: socially   Drug use: Yes    Types: Marijuana   Sexual activity: Yes  Other Topics Concern   Not on file  Social History Narrative   Not on file   Social Determinants of Health   Financial Resource Strain: Not on file  Food Insecurity: Not on file  Transportation Needs: Not on file  Physical Activity: Not on file  Stress: Not on file  Social Connections: Not on file  Intimate Partner Violence: Not on file     PHYSICAL EXAM:  VS: There were no vitals taken for this visit. Physical Exam Gen: NAD, alert, cooperative with exam, well-appearing MSK:  ***      ASSESSMENT & PLAN:   No problem-specific Assessment & Plan notes found for this encounter.     

## 2020-12-03 ENCOUNTER — Telehealth: Payer: Self-pay

## 2020-12-03 ENCOUNTER — Ambulatory Visit: Payer: 59

## 2020-12-03 NOTE — Telephone Encounter (Signed)
Spoke with patient regarding his 2nd no show. He reports that the time he gets off at work can fluctuate day to day. Due to this, he opted to cancel his next appointment and call the clinic back to reschedule at a time that will work better for him. He was educated on the clinic's attendance policy, noting that he will only be able to schedule 1 visit at a time moving forward and that a 3rd no-show will be grounds for discharge. He reports understanding of this.

## 2020-12-10 ENCOUNTER — Encounter: Payer: 59 | Admitting: Physical Therapy

## 2020-12-17 ENCOUNTER — Encounter: Payer: 59 | Admitting: Physical Therapy

## 2021-02-12 ENCOUNTER — Encounter (HOSPITAL_COMMUNITY): Payer: Self-pay | Admitting: Emergency Medicine

## 2021-02-12 ENCOUNTER — Ambulatory Visit (HOSPITAL_COMMUNITY)
Admission: EM | Admit: 2021-02-12 | Discharge: 2021-02-12 | Disposition: A | Discharge status: Home / Self Care | Payer: 59 | Attending: Urgent Care | Admitting: Urgent Care

## 2021-02-12 ENCOUNTER — Ambulatory Visit (INDEPENDENT_AMBULATORY_CARE_PROVIDER_SITE_OTHER): Payer: 59

## 2021-02-12 ENCOUNTER — Other Ambulatory Visit: Payer: Self-pay

## 2021-02-12 DIAGNOSIS — S91332A Puncture wound without foreign body, left foot, initial encounter: Secondary | ICD-10-CM

## 2021-02-12 MED ORDER — TETANUS-DIPHTH-ACELL PERTUSSIS 5-2.5-18.5 LF-MCG/0.5 IM SUSY
0.5000 mL | PREFILLED_SYRINGE | Freq: Once | INTRAMUSCULAR | Status: AC
  Administered 2021-02-12: 13:00:00 0.5 mL via INTRAMUSCULAR

## 2021-02-12 MED ORDER — CIPROFLOXACIN HCL 500 MG PO TABS: 500.0000 mg | ORAL_TABLET | Freq: Two times a day (BID) | ORAL | 0 refills | Status: AC

## 2021-02-12 MED ORDER — TETANUS-DIPHTH-ACELL PERTUSSIS 5-2.5-18.5 LF-MCG/0.5 IM SUSY
PREFILLED_SYRINGE | INTRAMUSCULAR | Status: AC
  Filled 2021-02-12: qty 0.5

## 2021-02-12 MED ORDER — AMOXICILLIN-POT CLAVULANATE 875-125 MG PO TABS: 1.0000 | ORAL_TABLET | Freq: Two times a day (BID) | ORAL | 0 refills | Status: AC

## 2021-02-12 MED ORDER — DICLOFENAC SODIUM 75 MG PO TBEC: DELAYED_RELEASE_TABLET | ORAL | 0 refills | Status: DC

## 2021-02-12 NOTE — ED Triage Notes (Signed)
Reports at work last night stepped on nail that went through shoe and into foot. Having a a lot of pain. Unsure when last tetanus shot was.

## 2021-02-12 NOTE — ED Provider Notes (Signed)
[facility]    CSN: [id number] Arrival date & time: 02/12/21  1142      History   Chief Complaint Chief Complaint  Patient presents with   Foot Injury    Stepped on nail last night     HPI Matthew Montgomery is a 60 y.o. male.    60 year old male presents today with a puncture wound injury to his left foot that occurred last evening.  States he was wearing tennis shoes at work and stepped on a pallet that had a nail sticking out.  He does not feel the nail went in very far, reports severe pain with ambulation swelling, and redness to his foot today.  His last tetanus shot is unknown.  He used to be a diabetic but denies any problems with blood sugar as of right now. He has tried no OTC medications for his symptoms.    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 No pertinent surgical history.     Home Medications    Prior to Admission medications   Medication Sig Start Date End Date Taking? Authorizing Provider  amoxicillin-clavulanate (AUGMENTIN) 875-125 MG tablet Take 1 tablet by mouth 2 (two) times daily for 10 days. 02/12/21 02/22/21 Yes Day Greb L, PA  ciprofloxacin (CIPRO) 500 MG tablet Take 1 tablet (500 mg total) by mouth 2 (two) times daily for 10 days. 02/12/21 02/22/21 Yes Tawn Fitzner L, PA  diclofenac (VOLTAREN) 75 MG EC tablet Take one tab twice  daily food as needed for pain and swelling 02/12/21  Yes Seva Chancy L, PA  Blood Glucose Monitoring Suppl (TRUE METRIX METER) w/Device KIT Use as instructed. Check blood glucose level by fingerstick twice per day. Will pick up today. Patient not taking: Reported on 11/19/2020 03/27/19   Gildardo Pounds, NP  cyclobenzaprine (FLEXERIL) 10 MG tablet Take 1 tablet (10 mg total) by mouth 2 (two) times daily as needed for muscle spasms. Patient not taking: Reported on 11/19/2020 10/15/20   Lennice Sites, DO  docusate sodium (COLACE) 100 MG capsule Take 1 capsule (100 mg total) by mouth 2 (two) times daily. Patient not taking: Reported on 11/19/2020 10/31/20   Fenton Foy, NP  predniSONE (DELTASONE) 5 MG tablet Take 6 pills for first day, 5 pills second day, 4 pills third day, 3 pills fourth day, 2 pills the fifth day, and 1 pill sixth day. Patient not taking: Reported on 11/19/2020 11/04/20   Rosemarie Ax, MD  scopolamine (TRANSDERM-SCOP, 1.5 MG,) 1 MG/3DAYS Place 1 patch (1.5 mg total) onto the skin every 3 (three) days. Patient not taking: Reported on 11/19/2020 09/01/20   Hans Eden, NP  sodium chloride (OCEAN) 0.65 % SOLN nasal spray Place 1 spray into both nostrils as needed for congestion. Patient not taking: Reported on 11/19/2020 08/08/19   Darr, Edison Nasuti, PA-C  TRUEplus Lancets 28G MISC Use as instructed.  Check blood glucose level by fingerstick twice per day. Will pick up today. Patient not taking: Reported on 11/19/2020 03/27/19   Gildardo Pounds, NP    Family History Family History  Problem Relation Age of Onset   Hypertension Mother    Diabetes Mother    Diabetes Father    Cancer Father     Social History Social History   Tobacco Use   Smoking status: Every Day    Packs/day: 0.50    Types: Cigarettes   Smokeless tobacco: Never  Vaping Use   Vaping Use: Never used  Substance Use Topics   Alcohol use: Yes    Comment: socially   Drug use: Yes    Types: Marijuana      Allergies   Patient has no known allergies.   Review of Systems Review of Systems  Constitutional: Negative.   HENT: Negative.    Eyes: Negative.   Respiratory: Negative.    Cardiovascular: Negative.   Gastrointestinal: Negative.   Endocrine: Negative.   Genitourinary: Negative.   Musculoskeletal:  Positive for joint swelling (Left foot puncture wound to sole of foot just proximal to base of 4th/5th digit; mild swelling and erythema). Negative for myalgias, neck pain and neck stiffness.  Skin:  Positive for wound (puncture wound and erythema, as above).  Neurological: Negative.   Hematological: Negative.   Psychiatric/Behavioral: Negative.    All other systems reviewed and are negative.   Physical Exam Triage Vital Signs ED Triage Vitals [02/12/21 1251]  Enc Vitals Group     BP 124/80     Pulse Rate 63     Resp 19     Temp 98.2 F (36.8 C)     Temp Source Oral     SpO2 100 %     Weight      Height      Head Circumference      Peak Flow      Pain Score      Pain Loc      Pain Edu?      Excl. in [place]?    No data found.  Updated Vital Signs BP 124/80 (BP Location: Left Arm)    Pulse 63    Temp 98.2 F (36.8 C) (Oral)    Resp 19    SpO2 100%   Visual Acuity Right Eye Distance:   Left Eye Distance:   Bilateral Distance:    Right Eye Near:   Left Eye Near:    Bilateral Near:     Physical Exam Vitals and nursing note reviewed.  Constitutional:      General: He is not in acute distress.    Appearance: He is well-developed.  HENT:     Head: Normocephalic and atraumatic.  Eyes:     Conjunctiva/sclera: Conjunctivae normal.  Cardiovascular:     Rate and Rhythm: Normal rate.  Pulmonary:     Effort: Pulmonary effort is normal. No respiratory distress.  Musculoskeletal:        General: No swelling (mild amount of swelling around dorsum and sole of L foot between 4th and 5th MTP).     Cervical back: Neck supple.  Skin:    General: Skin is warm and dry.      Capillary Refill: Capillary refill takes less than 2 seconds.     Findings: Erythema (to sole and dorsum of L foot between 4th and 5th MTP) present.     Comments: Small puncture wound to sole between 4th and 5th  MTP on L  Neurological:     General: No focal deficit present.     Mental Status: He is alert. Mental status is at baseline.     Motor: No weakness.     Gait: Gait normal.  Psychiatric:        Mood and Affect: Mood normal.     UC Treatments / Results  Labs (all labs ordered are listed, but only abnormal results are displayed) Labs Reviewed - No data to display  EKG   Radiology DG Foot Complete Left  Result Date: 02/12/2021 CLINICAL DATA:  Stepped on a nail last night. Entry point around the 4th MTP joint. EXAM: LEFT FOOT - COMPLETE 3+ VIEW COMPARISON:  None. FINDINGS: The mineralization and alignment are normal. There is no evidence of acute fracture or dislocation. The joint spaces appear preserved. No foreign body or definite soft tissue emphysema identified. IMPRESSION: No evidence of foreign body or acute osseous abnormality. Electronically Signed   By: Richardean Sale M.D.   On: 02/12/2021 13:34    Procedures Procedures (including critical care time)  Medications Ordered in UC Medications  Tdap (BOOSTRIX) injection 0.5 mL (0.5 mLs Intramuscular Given 02/12/21 1322)    Initial Impression / Assessment and Plan / UC Course  I have reviewed the triage vital signs and the nursing notes.  Pertinent labs & imaging results that were available during my care of the patient were reviewed by me and considered in my medical decision making (see chart for details).     Puncture wound - xray shows no FB or osseous injury. Tdap updated in clinic today. Pt encouraged to do epsom salt soak. NSAID as discussed for pain/ swelling, abx as prescribed.  Final Clinical Impressions(s) / UC Diagnoses   Final diagnoses:  Puncture wound of left foot, initial encounter      Discharge Instructions      Take antibiotics as prescribed. Eat yogurt or take a probiotic to prevent diarrhea. Soak your foot in Epsom salts for the next 24 hours. Take NSAID as needed for pain and swelling. Follow up with PCP in 7-10 days     ED Prescriptions     Medication Sig Dispense Auth. Provider   amoxicillin-clavulanate (AUGMENTIN) 875-125 MG tablet Take 1 tablet by mouth 2 (two) times daily for 10 days. 20 tablet Shaneya Taketa L, PA   ciprofloxacin (CIPRO) 500 MG tablet Take 1 tablet (500 mg total) by mouth 2 (two) times daily for 10 days. 20 tablet Aspin Palomarez L, PA   diclofenac (VOLTAREN) 75 MG EC tablet Take one tab twice daily food as needed for pain and swelling 10 tablet Delberta Folts L, PA      PDMP not reviewed this encounter.   Chaney Malling, [place] 02/12/21 1352

## 2021-02-12 NOTE — Discharge Instructions (Addendum)
Take antibiotics as prescribed. Eat yogurt or take a probiotic to prevent diarrhea. Soak your foot in Epsom salts for the next 24 hours. Take NSAID as needed for pain and swelling. Follow up with PCP in 7-10 days

## 2021-06-02 ENCOUNTER — Emergency Department (HOSPITAL_COMMUNITY)
Admission: EM | Admit: 2021-06-02 | Discharge: 2021-06-02 | Disposition: A | Discharge status: Home / Self Care | Payer: Managed Care, Other (non HMO) | Attending: Emergency Medicine | Admitting: Emergency Medicine

## 2021-06-02 ENCOUNTER — Other Ambulatory Visit: Payer: Self-pay

## 2021-06-02 ENCOUNTER — Encounter (HOSPITAL_COMMUNITY): Payer: Self-pay

## 2021-06-02 ENCOUNTER — Emergency Department (HOSPITAL_COMMUNITY): Payer: Managed Care, Other (non HMO)

## 2021-06-02 DIAGNOSIS — E119 Type 2 diabetes mellitus without complications: Secondary | ICD-10-CM | POA: Insufficient documentation

## 2021-06-02 DIAGNOSIS — R072 Precordial pain: Secondary | ICD-10-CM | POA: Diagnosis present

## 2021-06-02 DIAGNOSIS — R071 Chest pain on breathing: Secondary | ICD-10-CM

## 2021-06-02 DIAGNOSIS — Z79899 Other long term (current) drug therapy: Secondary | ICD-10-CM | POA: Insufficient documentation

## 2021-06-02 DIAGNOSIS — R0789 Other chest pain: Secondary | ICD-10-CM

## 2021-06-02 LAB — CBC WITH DIFFERENTIAL/PLATELET
Abs Immature Granulocytes: 0.03 10*3/uL (ref 0.00–0.07)
Basophils Absolute: 0.1 10*3/uL (ref 0.0–0.1)
Basophils Relative: 1 %
Eosinophils Absolute: 0.2 10*3/uL (ref 0.0–0.5)
Eosinophils Relative: 2 %
HCT: 41.3 % (ref 39.0–52.0)
Hemoglobin: 14.3 g/dL (ref 13.0–17.0)
Immature Granulocytes: 0 %
Lymphocytes Relative: 36 %
Lymphs Abs: 3.1 10*3/uL (ref 0.7–4.0)
MCH: 31.9 pg (ref 26.0–34.0)
MCHC: 34.6 g/dL (ref 30.0–36.0)
MCV: 92.2 fL (ref 80.0–100.0)
Monocytes Absolute: 0.7 10*3/uL (ref 0.1–1.0)
Monocytes Relative: 8 %
Neutro Abs: 4.5 10*3/uL (ref 1.7–7.7)
Neutrophils Relative %: 53 %
Platelets: 247 10*3/uL (ref 150–400)
RBC: 4.48 MIL/uL (ref 4.22–5.81)
RDW: 12.1 % (ref 11.5–15.5)
WBC: 8.5 10*3/uL (ref 4.0–10.5)
nRBC: 0 % (ref 0.0–0.2)

## 2021-06-02 LAB — COMPREHENSIVE METABOLIC PANEL
ALT: 19 U/L (ref 0–44)
AST: 25 U/L (ref 15–41)
Albumin: 3.9 g/dL (ref 3.5–5.0)
Alkaline Phosphatase: 52 U/L (ref 38–126)
Anion gap: 7 (ref 5–15)
BUN: 13 mg/dL (ref 6–20)
CO2: 25 mmol/L (ref 22–32)
Calcium: 9 mg/dL (ref 8.9–10.3)
Chloride: 106 mmol/L (ref 98–111)
Creatinine, Ser: 0.99 mg/dL (ref 0.61–1.24)
GFR, Estimated: >60 mL/min (ref >60)
Glucose, Bld: 92 mg/dL (ref 70–99)
Potassium: 3.7 mmol/L (ref 3.5–5.1)
Sodium: 138 mmol/L (ref 135–145)
Total Bilirubin: 0.6 mg/dL (ref 0.3–1.2)
Total Protein: 6.6 g/dL (ref 6.5–8.1)

## 2021-06-02 LAB — TROPONIN I (HIGH SENSITIVITY): Troponin I (High Sensitivity): 3 ng/L (ref <18)

## 2021-06-02 LAB — LIPASE, BLOOD: Lipase: 33 U/L (ref 11–51)

## 2021-06-02 NOTE — ED Provider Notes (Signed)
?[facility] ?Provider Note ? ? ?CSN: [id number] ?Arrival date & time: 06/02/21  1924 ? ?  ? ?History ? ?Chief Complaint  ?Patient presents with  ? Chest Pain  ? ? ?Matthew Montgomery is a 61 y.o. male. ? ?The history is provided by the patient and medical records. No language interpreter was used.  ?Chest Pain ?Pain location:  Substernal area and L chest ?Pain quality: aching and sharp   ?Pain radiates to:  L shoulder and L arm ?Pain severity:  Moderate ?Onset quality:  Gradual ?Duration:  1 day ?Timing:  Constant ?Progression:  Improving ?Chronicity:  New ?Relieved by:  Nitroglycerin ?Worsened by:  Nothing ?Associated symptoms: shortness of breath   ?Associated symptoms: no abdominal pain, no altered mental status, no anxiety, no back pain, no cough, no diaphoresis, no dysphagia, no fatigue, no headache, no lower extremity edema, no nausea, no numbness, no palpitations, no syncope, no vomiting and no weakness   ? ?  ? ?Home Medications ?Prior to Admission medications   ?Medication Sig Start Date End Date Taking? Authorizing Provider  ?Blood Glucose Monitoring Suppl (TRUE METRIX METER) w/Device KIT Use as instructed. Check blood glucose level by fingerstick twice per day. Will pick up today. ?Patient not taking: Reported on 11/19/2020 03/27/19   Gildardo Pounds, NP  ?cyclobenzaprine (FLEXERIL) 10 MG tablet Take 1 tablet (10 mg total) by mouth 2 (two) times daily as needed for muscle spasms. ?Patient not taking: Reported on 11/19/2020 10/15/20   Lennice Sites, DO  ?diclofenac (VOLTAREN) 75 MG EC tablet Take one tab twice daily food as needed for pain and swelling 02/12/21   Crain, Whitney L, PA  ?docusate sodium (COLACE) 100 MG capsule Take 1 capsule (100 mg total) by mouth 2 (two) times daily. ?Patient not taking: Reported on 11/19/2020 10/31/20   Fenton Foy, NP  ?predniSONE (DELTASONE) 5 MG tablet Take 6 pills for first day, 5 pills second day, 4 pills third day, 3 pills  fourth day, 2 pills the fifth day, and 1 pill sixth day. ?Patient not taking: Reported on 11/19/2020 11/04/20   Rosemarie Ax, MD  ?scopolamine (TRANSDERM-SCOP, 1.5 MG,) 1 MG/3DAYS Place 1 patch (1.5 mg total) onto the skin every 3 (three) days. ?Patient not taking: Reported on 11/19/2020 09/01/20   Hans Eden, NP  ?sodium chloride (OCEAN) 0.65 % SOLN nasal spray Place 1 spray into both nostrils as needed for congestion. ?Patient not taking: Reported on 11/19/2020 08/08/19   Darr, Edison Nasuti, PA-C  ?TRUEplus Lancets 28G MISC Use as instructed. Check blood glucose level by fingerstick twice per day. Will pick up today. ?Patient not taking: Reported on 11/19/2020 03/27/19   Gildardo Pounds, NP  ?   ? ?Allergies    ?Patient has no known allergies.   ? ?Review of Systems   ?Review of Systems  ?Constitutional:  Negative for chills, diaphoresis and fatigue.  ?HENT:  Negative for congestion and trouble swallowing.   ?Respiratory:  Positive for shortness of breath. Negative for cough, chest tightness and wheezing.   ?Cardiovascular:  Positive for chest pain. Negative for palpitations, leg swelling and syncope.  ?Gastrointestinal:  Negative for abdominal pain, constipation, diarrhea, nausea and vomiting.  ?Genitourinary:  Negative for dysuria and flank pain.  ?Musculoskeletal:  Negative for back pain, neck pain and neck stiffness.  ?Skin:  Negative for rash and wound.  ?Neurological:  Negative for weakness, numbness and headaches.  ?Psychiatric/Behavioral:  Negative for agitation and confusion.   ? ?  Physical Exam ?Updated Vital Signs ?There were no vitals taken for this visit. ?Physical Exam ?Vitals and nursing note reviewed.  ?Constitutional:   ?   General: He is not in acute distress. ?   Appearance: He is well-developed. He is not ill-appearing, toxic-appearing or diaphoretic.  ?HENT:  ?   Head: Normocephalic and atraumatic.  ?Eyes:  ?   Conjunctiva/sclera: Conjunctivae normal.  ?   Pupils: Pupils are equal, round, and  reactive to light.  ?Cardiovascular:  ?   Rate and Rhythm: Normal rate and regular rhythm.  ?   Heart sounds: Normal heart sounds. No murmur heard. ?Pulmonary:  ?   Effort: Pulmonary effort is normal. No respiratory distress.  ?   Breath sounds: Normal breath sounds. No decreased breath sounds, wheezing, rhonchi or rales.  ?Chest:  ?   Chest wall: No tenderness.  ?Abdominal:  ?   Palpations: Abdomen is soft.  ?   Tenderness: There is no abdominal tenderness.  ?Musculoskeletal:     ?   General: No swelling.  ?   Cervical back: Neck supple.  ?   Right lower leg: No edema.  ?   Left lower leg: No edema.  ?Skin: ?   General: Skin is warm and dry.  ?   Capillary Refill: Capillary refill takes less than 2 seconds.  ?   Findings: No erythema or rash.  ?Neurological:  ?   General: No focal deficit present.  ?   Mental Status: He is alert.  ?Psychiatric:     ?   Mood and Affect: Mood normal.  ? ? ?ED Results / Procedures / Treatments   ?Labs ?(all labs ordered are listed, but only abnormal results are displayed) ?Labs Reviewed  ?CBC WITH DIFFERENTIAL/PLATELET  ?COMPREHENSIVE METABOLIC PANEL  ?LIPASE, BLOOD  ?TROPONIN I (HIGH SENSITIVITY)  ?TROPONIN I (HIGH SENSITIVITY)  ? ? ?EKG ?EKG Interpretation ? ?Date/Time:  Monday June 02 2021 19:38:36 EDT ?Ventricular Rate:  61 ?PR Interval:  193 ?QRS Duration: 94 ?QT Interval:  389 ?QTC Calculation: 392 ?R Axis:   48 ?Text Interpretation: Sinus rhythm Probable left atrial enlargement Probable anteroseptal infarct, old when compared to prior, similar appearance. No STEMI Confirmed by Antony Blackbird [phone]) on 06/02/2021 7:45:46 PM ? ?Radiology ?DG Chest Portable 1 View ? ?Result Date: 06/02/2021 ?CLINICAL DATA:  Sharp left-sided chest pain EXAM: PORTABLE CHEST 1 VIEW COMPARISON:  08/30/2020 FINDINGS: The heart size and mediastinal contours are within normal limits. Both lungs are clear. The visualized skeletal structures are unremarkable. IMPRESSION: No active disease. Electronically  Signed   By: Donavan Foil M.D.   On: 06/02/2021 20:12   ? ?Procedures ?Procedures  ? ? ?Medications Ordered in ED ?Medications - No data to display ? ?ED Course/ Medical Decision Making/ A&P ?  ?                        ?Medical Decision Making ?Amount and/or Complexity of Data Reviewed ?Labs: ordered. ?Radiology: ordered. ? ? ? ?Matthew Montgomery is a 61 y.o. male with a past medical history significant for GERD and diabetes who presents with chest pain.  According to patient, he was at work this morning when he had onset of pain in his left central chest that feels like it goes into his left shoulder/arm.  He reports it is sharp and pleuritic and hurts when he takes a deep breath.  He reports a mild shortness of breath with it.  He denies  diaphoresis, nausea, vomiting, palpitations, or syncope.  He reports that he is never had this before.  He reports no recent spicy foods and reports it does not feel like reflux or GERD.  He does report that recently he has had some left leg pain but denies swelling.  Denies any history of DVT or PE.  Reports the pain was 8 out of 10 in severity onset and now is a 5 out of 10 after getting aspirin and nitro with EMS that helped.  He reports it does not go to his back and blood pressure was not elevated.  No history of dissection or aortic abnormalities.  He denies any neurologic complaints otherwise.  He denies recent fevers, chills, [place], or cough.  No recent infection symptoms.  Denies any constipation, diarrhea, or urinary changes. ? ?On exam, lungs are clear.  Chest is nontender.  Abdomen is nontender.  No murmur.  Good pulses in both upper and lower extremities.  I do not appreciate any edema in the legs.  No tenderness of the neck or back.  No carotid bruit appreciated.  Patient well-appearing now.  Vital signs reassuring and he is maintaining oxygen saturations on room air. ? ?EKG appears similar to prior with no STEMI.  Heart score calculated as a 4. ? ?Given the  patient's recent left leg pain and this new sharp pleuritic pain that is worsened with deep breathing and with some shortness of breath, I do feel it is important to rule out DVT and PE.  We will order a PE s

## 2021-06-02 NOTE — ED Triage Notes (Signed)
Pt arrived from home via [facility] w c/o chest pain currently 5/10 on pain scale with right arm pain. States that it started at work this am and has continued to get worse throughout the day. Received asa 324 and nitro 2 via ems ?  ?

## 2021-06-02 NOTE — Discharge Instructions (Signed)
Your history and exam today led [place] to start a work-up to rule out a cardiac, pulmonary, or thromboembolic cause of your chest discomfort.  The initial troponin which is a cardiac enzyme was negative however ideally we would get a second 1.  Your labs otherwise are reassuring thus far however I am still concerned about ruling out a blood clot.  You were going to get an ultrasound and a CT scan however as your symptoms have improved and you have proven stability for several hours, you wanted to go home.  He did not want to wait to get the CT PE study.  Thus, there is still a risk of having a blood clot in your lungs causing your symptoms however if you understand the risks up to and including death, we feel you can make a decision for going home.  Please follow-up with your primary doctor and I also includes follow-up with a cardiology team.  Please rest and stay hydrated.  If any symptoms were to change or worsen whatsoever, please return to the nearest emergency department as I anticipate they would get further cardiac enzymes, a CT PE study, and left leg ultrasound to rule out blood clots. ?

## 2021-12-10 ENCOUNTER — Encounter (HOSPITAL_COMMUNITY): Payer: Self-pay | Admitting: Emergency Medicine

## 2021-12-10 ENCOUNTER — Ambulatory Visit (HOSPITAL_COMMUNITY)
Admission: EM | Admit: 2021-12-10 | Discharge: 2021-12-10 | Disposition: A | Discharge status: Home / Self Care | Payer: 59 | Attending: Emergency Medicine | Admitting: Emergency Medicine

## 2021-12-10 ENCOUNTER — Other Ambulatory Visit: Payer: Self-pay

## 2021-12-10 DIAGNOSIS — M545 Low back pain, unspecified: Secondary | ICD-10-CM

## 2021-12-10 MED ORDER — CYCLOBENZAPRINE HCL 10 MG PO TABS: 10.0000 mg | ORAL_TABLET | Freq: Two times a day (BID) | ORAL | 0 refills | Status: DC | PRN

## 2021-12-10 MED ORDER — METHYLPREDNISOLONE SODIUM SUCC 125 MG IJ SOLR
INTRAMUSCULAR | Status: AC
  Filled 2021-12-10: qty 2

## 2021-12-10 MED ORDER — MELOXICAM 15 MG PO TABS: 15.0000 mg | ORAL_TABLET | Freq: Every day | ORAL | 0 refills | Status: DC

## 2021-12-10 MED ORDER — METHYLPREDNISOLONE SODIUM SUCC 125 MG IJ SOLR
60.0000 mg | Freq: Once | INTRAMUSCULAR | Status: AC
  Administered 2021-12-10: 60 mg via INTRAMUSCULAR

## 2021-12-10 NOTE — ED Triage Notes (Signed)
Recently has been doing a lot of bending and lifting.  Back has been hurting 2-3 weeks.    Patient has not tried any medications.  Left lower back pain radiating into left leg.  This episode is not any different from prior episodes

## 2021-12-10 NOTE — ED Provider Notes (Signed)
[employer]    CSN: [id number] Arrival date & time: 12/10/21  1844      History   Chief Complaint Chief Complaint  Patient presents with   Back Pain    HPI Matthew Montgomery is a 61 y.o. male.   Patient presents with intermittent left lower back pain occurring for 2 to 3 weeks.  Symptoms are worsened with bending and lifting which is a primary part of the job.  Pain radiates down the left lower extremity without associated numbness or tingling.  Has not attempted treatment of symptoms.  Denies injury or trauma.   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 No pertinent surgical history.     Home Medications    Prior to Admission medications   Medication Sig Start Date End Date Taking? Authorizing Provider  Blood Glucose Monitoring Suppl (TRUE METRIX METER) w/Device KIT Use as instructed. Check blood glucose level by fingerstick twice per day. Will pick up today. Patient not taking: Reported on 11/19/2020 03/27/19   Gildardo Pounds, NP  cyclobenzaprine (FLEXERIL) 10 MG tablet Take 1 tablet (10 mg total) by mouth 2 (two) times daily as needed for muscle spasms. Patient not taking: Reported on 11/19/2020 10/15/20   Lennice Sites, DO  diclofenac (VOLTAREN) 75 MG EC tablet Take one tab twice daily food as needed for pain and swelling 02/12/21   Crain, Whitney L, PA  docusate sodium (COLACE) 100 MG capsule Take 1 capsule (100  mg total) by mouth 2 (two) times daily. Patient not taking: Reported on 11/19/2020 10/31/20   Fenton Foy, NP  predniSONE (DELTASONE) 5 MG tablet Take 6 pills for first day, 5 pills second day, 4 pills third day, 3 pills fourth day, 2 pills the fifth day, and 1 pill sixth day. Patient not taking: Reported on 11/19/2020 11/04/20   Rosemarie Ax, MD  scopolamine (TRANSDERM-SCOP, 1.5 MG,) 1 MG/3DAYS Place 1 patch (1.5 mg total) onto the skin every 3 (three) days. Patient not taking: Reported on 11/19/2020 09/01/20   Hans Eden, NP  sodium chloride (OCEAN) 0.65 % SOLN nasal spray Place 1 spray into both nostrils as needed for congestion. Patient not taking: Reported on 11/19/2020 08/08/19   Darr, Edison Nasuti, PA-C  TRUEplus Lancets 28G MISC Use as instructed. Check blood glucose level by fingerstick twice per day. Will pick up today. Patient not taking: Reported on 11/19/2020 03/27/19   Gildardo Pounds, NP    Family History Family History  Problem Relation Age of Onset   Hypertension Mother    Diabetes Mother    Diabetes Father    Cancer Father     Social History Social History   Tobacco Use   Smoking status: Every Day    Packs/day: 0.50    Types: Cigarettes   Smokeless tobacco: Never  Vaping Use   Vaping Use: Never used  Substance Use Topics   Alcohol use: Yes    Comment:  socially   Drug use: Yes    Types: Marijuana     Allergies   Patient has no known allergies.   Review of Systems Review of Systems  Constitutional: Negative.   Respiratory: Negative.    Cardiovascular: Negative.   Gastrointestinal: Negative.   Musculoskeletal:  Positive for back pain. Negative for arthralgias, gait problem, joint swelling, myalgias, neck pain and neck stiffness.  Skin: Negative.      Physical Exam Triage Vital Signs ED Triage Vitals  Enc Vitals Group     BP 12/10/21 1913 123/86     Pulse Rate 12/10/21 1913 73     Resp 12/10/21 1913 20     Temp 12/10/21 1913 98.6 F (37 C)      Temp Source 12/10/21 1913 Oral     SpO2 12/10/21 1913 97 %     Weight --      Height --      Head Circumference --      Peak Flow --      Pain Score 12/10/21 1912 10     Pain Loc --      Pain Edu? --      Excl. in [place]? --    No data found.  Updated Vital Signs BP 123/86 (BP Location: Left Arm)   Pulse 73   Temp 98.6 F (37 C) (Oral)   Resp 20   SpO2 97%   Visual Acuity Right Eye Distance:   Left Eye Distance:   Bilateral Distance:    Right Eye Near:   Left Eye Near:    Bilateral Near:     Physical Exam Constitutional:      Appearance: Normal appearance.  HENT:     Head: Normocephalic.  Eyes:     Extraocular Movements: Extraocular movements intact.  Pulmonary:     Effort: Pulmonary effort is normal.  Musculoskeletal:     Comments: No lumbar spinal tenderness on exam, tenderness present over the left lower latissimus dorsi without ecchymosis, swelling or deformity, able to twist turn and bend but this elicits pain, straight leg test, able to sit erect without complication  Neurological:     Mental Status: He is alert and oriented to person, place, and time. Mental status is at baseline.      UC Treatments / Results  Labs (all labs ordered are listed, but only abnormal results are displayed) Labs Reviewed - No data to display  EKG   Radiology No results found.  Procedures Procedures (including critical care time)  Medications Ordered in UC Medications - No data to display  Initial Impression / Assessment and Plan / UC Course  I have reviewed the triage vital signs and the nursing notes.  Pertinent labs & imaging results that were available during my care of the patient were reviewed by me and considered in my medical decision making (see chart for details).  Acute left lower back pain without sciatica  Etiology is most likely muscular, discussed with patient, methylprednisolone injection given in office and prescribed Flexeril and meloxicam for  outpatient use, recommended RICE, heat, massage, daily stretching and activity as tolerated, given information to orthopedics if symptoms persist or worsen Final Clinical Impressions(s) / UC Diagnoses   Final diagnoses:  None   Discharge Instructions   None    ED Prescriptions   None    PDMP not reviewed this encounter.   Hans Eden, NP 12/10/21 1954

## 2021-12-10 NOTE — Discharge Instructions (Signed)
Your pain is most likely caused by irritation to the muscles.  Been given an injection of steroids today here in the office to help reduce your pain, steroids help to reduce inflammation within the body  Starting tomorrow take meloxicam every morning for 5 days to continue the above process then you may use as needed  Use muscle relaxer twice daily for additional comfort, be mindful this medication will make you drowsy  You may use heating pad in 15 minute intervals as needed for additional comfort, or you may find comfort in using ice in 10-15 minutes over affected area  Begin stretching affected area daily for 10 minutes as tolerated to further loosen muscles   When lying down place pillow underneath and between knees for support  Can try sleeping without pillow on firm mattress   Practice good posture: head back, shoulders back, chest forward, pelvis back and weight distributed evenly on both legs  If pain persist after recommended treatment or reoccurs if may be beneficial to follow up with orthopedic specialist for evaluation, this doctor specializes in the bones and can manage your symptoms long-term with options such as but not limited to imaging, medications or physical therapy

## 2022-01-29 ENCOUNTER — Ambulatory Visit (HOSPITAL_COMMUNITY)
Admission: EM | Admit: 2022-01-29 | Discharge: 2022-01-29 | Disposition: A | Discharge status: Home / Self Care | Payer: Managed Care, Other (non HMO)

## 2022-01-29 ENCOUNTER — Encounter (HOSPITAL_COMMUNITY): Payer: Self-pay

## 2022-01-29 DIAGNOSIS — R6883 Chills (without fever): Secondary | ICD-10-CM

## 2022-01-29 DIAGNOSIS — B349 Viral infection, unspecified: Secondary | ICD-10-CM | POA: Diagnosis not present

## 2022-01-29 DIAGNOSIS — R051 Acute cough: Secondary | ICD-10-CM

## 2022-01-29 NOTE — Discharge Instructions (Signed)
Symptoms are improving so you may return to work tomorrow. Note is at the back of the packet. Keep taking tylenol as needed for fever/chills.  If you wake up tomorrow feeling worse again with chills, you may go back to work on Monday.   If you develop any new or worsening symptoms or do not improve in the next 2 to 3 days, please return.  If your symptoms are severe, please go to the emergency room.  Follow-up with your primary care provider for further evaluation and management of your symptoms as well as ongoing wellness visits.  I hope you feel better!

## 2022-01-29 NOTE — ED Triage Notes (Signed)
Pt c/o cough and fever with body aches and sweaty since Tuesday. States taking therma flu with relief. States needs a work note.

## 2022-02-02 NOTE — ED Provider Notes (Signed)
[facility]    CSN: [id number] Arrival date & time: 01/29/22  1215      History   Chief Complaint Chief Complaint  Patient presents with   Cough   Fever    HPI Matthew Montgomery is a 61 y.o. male.   Patient presents to urgent care for evaluation of fever/chills, body aches, cough, and nasal congestion that started 2 days ago on January 27, 2022. Symptoms have improved significantly since symptom onset and patient would like a note to return to work. Has been taking theraflu with relief. Unknown highest temp at home, but states very sweaty at nighttime after taking theraflu (assumes fever breaks after taking this). Woke up this morning feeling much better, job would like him to have a note from a provider for him to return to work. History of diabetes mellitus, current everyday cigarette smoker. Denies other drug use. No chronic respiratory problems reported.    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 No pertinent surgical history.     Home Medications    Prior to Admission medications   Medication Sig Start Date End Date Taking? Authorizing Provider  Blood Glucose Monitoring Suppl (TRUE METRIX METER) w/Device KIT Use as instructed. Check blood glucose level by fingerstick twice per day. Will pick up today. Patient not taking: Reported on  11/19/2020 03/27/19   Gildardo Pounds, NP  cyclobenzaprine (FLEXERIL) 10 MG tablet Take 1 tablet (10 mg total) by mouth 2 (two) times daily as needed for muscle spasms. 12/10/21   Hans Eden, NP  diclofenac (VOLTAREN) 75 MG EC tablet Take one tab twice daily food as needed for pain and swelling 02/12/21   Crain, Whitney L, PA  docusate sodium (COLACE) 100 MG capsule Take 1 capsule (100 mg total) by mouth 2 (two) times daily. Patient not taking: Reported on 11/19/2020 10/31/20   Fenton Foy, NP  meloxicam (MOBIC) 15 MG tablet Take 1 tablet (15 mg total) by mouth daily. 12/10/21   White, Leitha Schuller, NP  predniSONE (DELTASONE) 5 MG tablet Take 6 pills for first day, 5 pills second day, 4 pills third day, 3 pills fourth day, 2 pills the fifth day, and 1 pill sixth day. Patient not taking: Reported on 11/19/2020 11/04/20   Rosemarie Ax, MD  scopolamine (TRANSDERM-SCOP, 1.5 MG,) 1 MG/3DAYS Place 1 patch (1.5 mg total) onto the skin every 3 (three) days. Patient not taking: Reported on 11/19/2020 09/01/20   Hans Eden, NP  sodium chloride (OCEAN) 0.65 % SOLN nasal spray Place 1 spray into both nostrils as needed for congestion. Patient not taking: Reported on 11/19/2020 08/08/19   Darr, Edison Nasuti, PA-C  TRUEplus Lancets 28G MISC Use as instructed. Check blood glucose level by fingerstick twice per day. Will pick up today. Patient not taking: Reported on 11/19/2020 03/27/19   Gildardo Pounds, NP  Family History Family History  Problem Relation Age of Onset   Hypertension Mother    Diabetes Mother    Diabetes Father    Cancer Father     Social History Social History   Tobacco Use   Smoking status: Every Day    Packs/day: 0.50    Types: Cigarettes   Smokeless tobacco: Never  Vaping Use   Vaping Use: Never used  Substance Use Topics   Alcohol use: Yes    Comment: socially   Drug use: Yes    Types: Marijuana     Allergies   Patient has no known allergies.   Review of  Systems Review of Systems  Constitutional:  Positive for fever.  Respiratory:  Positive for cough.      Physical Exam Triage Vital Signs ED Triage Vitals [01/29/22 1547]  Enc Vitals Group     BP 139/80     Pulse Rate 68     Resp 18     Temp 98.1 F (36.7 C)     Temp Source Oral     SpO2 98 %     Weight      Height      Head Circumference      Peak Flow      Pain Score 0     Pain Loc      Pain Edu?      Excl. in [place]?    No data found.  Updated Vital Signs BP 139/80 (BP Location: Left Arm)   Pulse 68   Temp 98.1 F (36.7 C) (Oral)   Resp 18   SpO2 98%   Visual Acuity Right Eye Distance:   Left Eye Distance:   Bilateral Distance:    Right Eye Near:   Left Eye Near:    Bilateral Near:     Physical Exam Vitals and nursing note reviewed.  Constitutional:      Appearance: Normal appearance. He is not ill-appearing or toxic-appearing.  HENT:     Head: Normocephalic and atraumatic.     Right Ear: Hearing, tympanic membrane, ear canal and external ear normal.     Left Ear: Hearing, tympanic membrane, ear canal and external ear normal.     Nose: Congestion present.     Mouth/Throat:     Lips: Pink.     Mouth: Mucous membranes are moist.     Pharynx: No posterior oropharyngeal erythema.  Eyes:     General: Lids are normal. Vision grossly intact. Gaze aligned appropriately.     Extraocular Movements: Extraocular movements intact.     Conjunctiva/sclera: Conjunctivae normal.  Cardiovascular:     Rate and Rhythm: Normal rate and regular rhythm.     Heart sounds: Normal heart sounds, S1 normal and S2 normal.  Pulmonary:     Effort: Pulmonary effort is normal. No respiratory distress.     Breath sounds: Normal breath sounds and air entry.  Musculoskeletal:     Cervical back: Neck supple.  Skin:    General: Skin is warm and dry.     Capillary Refill: Capillary refill takes less than 2 seconds.     Findings: No rash.  Neurological:     General: No focal deficit  present.     Mental Status: He is alert and oriented to person, place, and time. Mental status is at baseline.     Cranial Nerves: No dysarthria or facial asymmetry.  Psychiatric:        Mood and Affect: Mood normal.  Speech: Speech normal.        Behavior: Behavior normal.        Thought Content: Thought content normal.        Judgment: Judgment normal.      UC Treatments / Results  Labs (all labs ordered are listed, but only abnormal results are displayed) Labs Reviewed - No data to display  EKG   Radiology No results found.  Procedures Procedures (including critical care time)  Medications Ordered in UC Medications - No data to display  Initial Impression / Assessment and Plan / UC Course  I have reviewed the triage vital signs and the nursing notes.  Pertinent labs & imaging results that were available during my care of the patient were reviewed by me and considered in my medical decision making (see chart for details).   1. Viral URI with cough, chills Symptoms are improving, therefore we will defer viral testing. Low suspicion for COVID-19 or influenza. Patient is clinically well appearing with hemodynamically stable vital signs, therefore we deferred imaging today. May continue OTC meds at home for symptom relief as these are helping. Work note to return tomorrow provided.   Discussed physical exam and available lab work findings in clinic with patient.  Counseled patient regarding appropriate use of medications and potential side effects for all medications recommended or prescribed today. Discussed red flag signs and symptoms of worsening condition,when to call the PCP office, return to urgent care, and when to seek higher level of care in the emergency department. Patient verbalizes understanding and agreement with plan. All questions answered. Patient discharged in stable condition.    Final Clinical Impressions(s) / UC Diagnoses   Final diagnoses:  Viral  illness  Acute cough  Chills     Discharge Instructions      Symptoms are improving so you may return to work tomorrow. Note is at the back of the packet. Keep taking tylenol as needed for fever/chills.  If you wake up tomorrow feeling worse again with chills, you may go back to work on Monday.   If you develop any new or worsening symptoms or do not improve in the next 2 to 3 days, please return.  If your symptoms are severe, please go to the emergency room.  Follow-up with your primary care provider for further evaluation and management of your symptoms as well as ongoing wellness visits.  I hope you feel better!   ED Prescriptions   None    PDMP not reviewed this encounter.   Talbot Grumbling, [place] 02/02/22 1259

## 2022-06-08 ENCOUNTER — Encounter: Payer: Self-pay | Admitting: *Deleted

## 2022-09-03 ENCOUNTER — Encounter (HOSPITAL_COMMUNITY): Payer: Self-pay | Admitting: Emergency Medicine

## 2022-09-03 ENCOUNTER — Other Ambulatory Visit: Payer: Self-pay

## 2022-09-03 ENCOUNTER — Ambulatory Visit (HOSPITAL_COMMUNITY)
Admission: EM | Admit: 2022-09-03 | Discharge: 2022-09-03 | Disposition: A | Discharge status: Home / Self Care | Payer: 59 | Attending: Emergency Medicine | Admitting: Emergency Medicine

## 2022-09-03 DIAGNOSIS — M545 Low back pain, unspecified: Secondary | ICD-10-CM

## 2022-09-03 LAB — POCT FASTING CBG KUC MANUAL ENTRY: POCT Glucose (KUC): 95 mg/dL (ref 70–99)

## 2022-09-03 MED ORDER — METHYLPREDNISOLONE SODIUM SUCC 125 MG IJ SOLR
60.0000 mg | Freq: Once | INTRAMUSCULAR | Status: AC
  Administered 2022-09-03: 60 mg via INTRAMUSCULAR

## 2022-09-03 MED ORDER — CYCLOBENZAPRINE HCL 10 MG PO TABS: 10.0000 mg | ORAL_TABLET | Freq: Every day | ORAL | 0 refills | Status: DC

## 2022-09-03 MED ORDER — METHYLPREDNISOLONE SODIUM SUCC 125 MG IJ SOLR
INTRAMUSCULAR | Status: AC
  Filled 2022-09-03: qty 2

## 2022-09-03 MED ORDER — PREDNISONE 20 MG PO TABS: 40.0000 mg | ORAL_TABLET | Freq: Every day | ORAL | 0 refills | Status: DC

## 2022-09-03 NOTE — Discharge Instructions (Signed)
Your pain is most likely caused by irritation to the muscles most likely related to your work.   Blood glucose in office is 95, elevation 1 day ago is most likely related to sugary drink that was consumed  Lightheadedness and dizziness that was experienced is most likely related to minimal water intake while working in the heat, try to ensure that you are drinking lots of water and electrolyte replacement drinks such as Gatorade or similar product  You have been given an injection of steroids here today in the office, ideally you will see relief in about 30 minutes  Starting tomorrow take prednisone every morning with food for 5 days as needed, may take Tylenol in addition to this  May use muscle relaxant at bedtime as needed for additional comfort, be mindful this may make you feel drowsy   You may use heating pad in 15 minute intervals as needed for additional comfort, you may find comfort in using ice in 10-15 minutes over affected area  Begin stretching affected area daily for 10 minutes as tolerated to further loosen muscles   When lying down place pillow underneath and between knees for support   Practice good posture: head back, shoulders back, chest forward, pelvis back and weight distributed evenly on both legs  If pain persist after recommended treatment or reoccurs if may be beneficial to follow up with orthopedic specialist for evaluation, this doctor specializes in the bones and can manage your symptoms long-term with options such as but not limited to imaging, medications or physical therapy

## 2022-09-03 NOTE — ED Provider Notes (Signed)
[facility]    CSN: [id number] Arrival date & time: 09/03/22  1218      History   Chief Complaint No chief complaint on file.   HPI Matthew Montgomery is a 62 y.o. male.    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  Endorses that when he became symptomatic he checked his blood sugar and it was 222, typically well-controlled, recheck blood sugar this morning it was 182 fasting, has eaten a sausage biscuit since.  Endorses that he was drinking Kool-Aid prior to checking sugar yesterday and after it was elevated began to drink water.  Job does require pushing pulling and lifting.  Pain feels to be wrapping around the waist.  He has been experiencing bilateral leg cramping almost as if catching a charley horse.  Able to complete all range of motion.  Denies numbness tingling or urinary symptoms.    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 No pertinent surgical history.     Home Medications    Prior to Admission medications   Medication Sig Start Date End Date Taking? Authorizing Provider  Blood Glucose Monitoring Suppl (TRUE METRIX METER) w/Device KIT Use as instructed. Check blood glucose level by fingerstick twice per day. Will pick up today. Patient not taking: Reported on 11/19/2020 03/27/19   Claiborne Rigg, NP   cyclobenzaprine (FLEXERIL) 10 MG tablet Take 1 tablet (10 mg total) by mouth 2 (two) times daily as needed for muscle spasms. 12/10/21   Valinda Hoar, NP  diclofenac (VOLTAREN) 75 MG EC tablet Take one tab twice daily food as needed for pain and swelling Patient not taking: Reported on 09/03/2022 02/12/21   Guy Sandifer L, PA  docusate sodium (COLACE) 100 MG capsule Take 1 capsule (100 mg total) by mouth 2 (two) times daily. Patient not taking: Reported on 11/19/2020 10/31/20   Ivonne Andrew, NP  meloxicam (MOBIC) 15 MG tablet Take 1 tablet (15 mg total) by mouth daily. Patient not taking: Reported on 09/03/2022 12/10/21   Valinda Hoar, NP  predniSONE (DELTASONE) 5 MG tablet Take 6 pills for first day, 5 pills second day, 4 pills third day, 3 pills fourth day, 2 pills the fifth day, and 1 pill sixth day. Patient not taking: Reported on 11/19/2020 11/04/20   Myra Rude, MD  scopolamine (TRANSDERM-SCOP, 1.5 MG,) 1 MG/3DAYS Place 1 patch (1.5 mg total) onto the skin every 3 (three) days. Patient not taking: Reported on 11/19/2020 09/01/20   Valinda Hoar, NP  sodium chloride (OCEAN) 0.65 % SOLN nasal spray Place 1 spray into both nostrils as needed for congestion. Patient not taking: Reported on 11/19/2020 08/08/19   Darr, Gerilyn Pilgrim, PA-C  TRUEplus Lancets 28G MISC Use as instructed. Check blood glucose level by fingerstick twice per day. Will pick up today. Patient not taking: Reported on 11/19/2020 03/27/19  Claiborne Rigg, NP    Family History Family History  Problem Relation Age of Onset   Hypertension Mother    Diabetes Mother    Diabetes Father    Cancer Father     Social History Social History   Tobacco Use   Smoking status: Every Day    Current packs/day: 0.50    Types: Cigarettes   Smokeless tobacco: Never  Vaping Use   Vaping status: Never Used  Substance Use Topics   Alcohol use: Yes    Comment: socially   Drug use: Yes    Types: Marijuana      Allergies   Patient has no known allergies.   Review of Systems Review of Systems   Physical Exam Triage Vital Signs ED Triage Vitals  Encounter Vitals Group     BP 09/03/22 1352 114/75     Systolic BP Percentile --      Diastolic BP Percentile --      Pulse Rate 09/03/22 1352 60     Resp 09/03/22 1352 18     Temp 09/03/22 1352 (!) 97 F (36.1 C)     Temp Source 09/03/22 1352 Oral     SpO2 09/03/22 1352 98 %     Weight --      Height --      Head Circumference --      Peak Flow --      Pain Score 09/03/22 1350 7     Pain Loc --      Pain Education --      Exclude from Growth Chart --    No data found.  Updated Vital Signs BP 114/75 (BP Location: Right Arm)   Pulse 60   Temp (!) 97 F (36.1 C) (Oral)   Resp 18   SpO2 98%   Visual Acuity Right Eye Distance:   Left Eye Distance:   Bilateral Distance:    Right Eye Near:   Left Eye Near:    Bilateral Near:     Physical Exam Constitutional:      Appearance: Normal appearance.  Eyes:     Extraocular Movements: Extraocular movements intact.  Pulmonary:     Effort: Pulmonary effort is normal.  Musculoskeletal:     Comments: Generalized tenderness to the lumbar region without ecchymosis swelling or deformity, able to twist turn and bend, negative straight leg test  Neurological:     Mental Status: He is alert and oriented to person, place, and time. Mental status is at baseline.      UC Treatments / Results  Labs (all labs ordered are listed, but only abnormal results are displayed) Labs Reviewed - No data to display  EKG   Radiology No results found.  Procedures Procedures (including critical care time)  Medications Ordered in UC Medications - No data to display  Initial Impression / Assessment and Plan / UC Course  I have reviewed the triage vital signs and the nursing notes.  Pertinent labs & imaging results that were available during my care of the patient were reviewed by me and  considered in my medical decision making (see chart for details).  Acute bilateral low back pain without sciatica  Point-of-care CBG 95, controlled, ovation yesterday may have been related to diet as he was consuming a sugary drink, discussed this with patient, advised to monitor and if continuing to be elevated then he will need to establish with PCP for management, verbalized understanding, back pain most likely muscular related to  job, methylprednisolone injection given in office and prescribed prednisone and Flexeril states had success with this in the past, recommended RICE heat massage stretching and activity as tolerated, may follow-up with urgent care as needed Final Clinical Impressions(s) / UC Diagnoses   Final diagnoses:  None   Discharge Instructions   None    ED Prescriptions   None    PDMP not reviewed this encounter.   Valinda Hoar, [place] 09/06/22 [phone]

## 2022-09-03 NOTE — ED Triage Notes (Signed)
Intermittent lightheaded noticed yesterday.  Patient was loading trucks in the heat.  222 cbg reading yesterday.  This morning cbg read 82.   Complains of pain in lower back, feels tired and drained  Patient does not have a PCP.

## 2023-01-07 ENCOUNTER — Encounter (HOSPITAL_COMMUNITY): Payer: Self-pay | Admitting: *Deleted

## 2023-01-07 ENCOUNTER — Ambulatory Visit (HOSPITAL_COMMUNITY)
Admission: EM | Admit: 2023-01-07 | Discharge: 2023-01-07 | Disposition: A | Discharge status: Home / Self Care | Payer: 59 | Attending: Family Medicine | Admitting: Family Medicine

## 2023-01-07 DIAGNOSIS — G8929 Other chronic pain: Secondary | ICD-10-CM | POA: Diagnosis not present

## 2023-01-07 DIAGNOSIS — M5442 Lumbago with sciatica, left side: Secondary | ICD-10-CM | POA: Diagnosis not present

## 2023-01-07 DIAGNOSIS — K047 Periapical abscess without sinus: Secondary | ICD-10-CM

## 2023-01-07 MED ORDER — KETOROLAC TROMETHAMINE 30 MG/ML IJ SOLN
INTRAMUSCULAR | Status: AC
  Filled 2023-01-07: qty 1

## 2023-01-07 MED ORDER — KETOROLAC TROMETHAMINE 30 MG/ML IJ SOLN
30.0000 mg | Freq: Once | INTRAMUSCULAR | Status: AC
  Administered 2023-01-07: 30 mg via INTRAMUSCULAR

## 2023-01-07 MED ORDER — AMOXICILLIN-POT CLAVULANATE 875-125 MG PO TABS: 1.0000 | ORAL_TABLET | Freq: Two times a day (BID) | ORAL | 0 refills | Status: AC

## 2023-01-07 MED ORDER — TIZANIDINE HCL 4 MG PO TABS: 4.0000 mg | ORAL_TABLET | Freq: Three times a day (TID) | ORAL | 0 refills | Status: DC | PRN

## 2023-01-07 MED ORDER — KETOROLAC TROMETHAMINE 10 MG PO TABS: 10.0000 mg | ORAL_TABLET | Freq: Four times a day (QID) | ORAL | 0 refills | Status: DC | PRN

## 2023-01-07 NOTE — ED Provider Notes (Addendum)
[facility]    CSN: [id number] Arrival date & time: 01/07/23  1338      History   Chief Complaint Chief Complaint  Patient presents with   Back Pain   Dental Pain    HPI Matthew Montgomery is a 62 y.o. male.    Back Pain Dental Pain Here for back pain that has bothered him for a long time but is worse lately.  No recent trauma or fall.  No fever or hematuria or vomiting.  No bowel or bladder incontinence.  The back pain radiates down his left leg some.  Also he has pain in his left jaw and states he needs to see a dentist.  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 No pertinent surgical history.     Home Medications    Prior to Admission medications   Medication Sig Start Date End Date Taking? Authorizing Provider  amoxicillin-clavulanate (AUGMENTIN) 875-125 MG tablet Take 1 tablet by mouth 2 (two) times daily for 7 days. 01/07/23 01/14/23 Yes Zenia Resides, MD  ketorolac (TORADOL) 10 MG tablet Take 1 tablet (10 mg total) by mouth every 6 (six) hours as needed (pain). 01/07/23  Yes Zenia Resides, MD  tiZANidine (ZANAFLEX) 4 MG tablet Take 1 tablet (4 mg total) by mouth every 8 (eight) hours as needed for muscle spasms. 01/07/23  Yes Zenia Resides, MD  Blood Glucose Monitoring Suppl (TRUE METRIX METER) w/Device KIT Use as instructed. Check blood glucose level by  fingerstick twice per day. Will pick up today. Patient not taking: Reported on 11/19/2020 03/27/19   Claiborne Rigg, NP  cyclobenzaprine (FLEXERIL) 10 MG tablet Take 1 tablet (10 mg total) by mouth at bedtime. 09/03/22   White, Elita Boone, NP  docusate sodium (COLACE) 100 MG capsule Take 1 capsule (100 mg total) by mouth 2 (two) times daily. Patient not taking: Reported on 11/19/2020 10/31/20   Ivonne Andrew, NP  sodium chloride (OCEAN) 0.65 % SOLN nasal spray Place 1 spray into both nostrils as needed for congestion. Patient not taking: Reported on 11/19/2020 08/08/19   Darr, Gerilyn Pilgrim, PA-C  TRUEplus Lancets 28G MISC Use as instructed. Check blood glucose level by fingerstick twice per day. Will pick up today. Patient not taking: Reported on 11/19/2020 03/27/19   Claiborne Rigg, NP    Family History Family History  Problem Relation Age of Onset   Hypertension Mother    Diabetes Mother    Diabetes Father    Cancer Father     Social History Social History   Tobacco Use   Smoking status: Every Day    Current packs/day: 0.50    Types: Cigarettes   Smokeless tobacco: Never  Vaping Use   Vaping status: Never Used  Substance Use Topics   Alcohol use: Yes    Comment: socially   Drug use: Yes    Types: Marijuana  Allergies   Patient has no known allergies.   Review of Systems Review of Systems  Musculoskeletal:  Positive for back pain.     Physical Exam Triage Vital Signs ED Triage Vitals  Encounter Vitals Group     BP 01/07/23 1355 115/80     Systolic BP Percentile --      Diastolic BP Percentile --      Pulse Rate 01/07/23 1355 65     Resp 01/07/23 1355 18     Temp 01/07/23 1355 97.8 F (36.6 C)     Temp Source 01/07/23 1355 Oral     SpO2 01/07/23 1355 97 %     Weight --      Height --      Head Circumference --      Peak Flow --      Pain Score 01/07/23 1353 10     Pain Loc --      Pain Education --      Exclude from Growth Chart --    No data  found.  Updated Vital Signs BP 115/80 (BP Location: Left Arm)   Pulse 65   Temp 97.8 F (36.6 C) (Oral)   Resp 18   SpO2 97%   Visual Acuity Right Eye Distance:   Left Eye Distance:   Bilateral Distance:    Right Eye Near:   Left Eye Near:    Bilateral Near:     Physical Exam Vitals reviewed.  Constitutional:      General: He is not in acute distress.    Appearance: He is not ill-appearing, toxic-appearing or diaphoretic.  HENT:     Mouth/Throat:     Mouth: Mucous membranes are moist.  Eyes:     Extraocular Movements: Extraocular movements intact.     Conjunctiva/sclera: Conjunctivae normal.     Pupils: Pupils are equal, round, and reactive to light.  Cardiovascular:     Rate and Rhythm: Normal rate and regular rhythm.     Heart sounds: No murmur heard. Pulmonary:     Effort: Pulmonary effort is normal.     Breath sounds: Normal breath sounds.  Abdominal:     Palpations: Abdomen is soft.     Tenderness: There is no abdominal tenderness.  Musculoskeletal:     Cervical back: Neck supple.     Comments: Straight leg raise is positive on the left.  Lymphadenopathy:     Cervical: No cervical adenopathy.  Skin:    Coloration: Skin is not jaundiced or pale.  Neurological:     General: No focal deficit present.     Mental Status: He is alert and oriented to person, place, and time.  Psychiatric:        Behavior: Behavior normal.      UC Treatments / Results  Labs (all labs ordered are listed, but only abnormal results are displayed) Labs Reviewed - No data to display  EKG   Radiology No results found.  Procedures Procedures (including critical care time)  Medications Ordered in UC Medications  ketorolac (TORADOL) 30 MG/ML injection 30 mg (30 mg Intramuscular Given 01/07/23 1419)    Initial Impression / Assessment and Plan / UC Course  I have reviewed the triage vital signs and the nursing notes.  Pertinent labs & imaging results that were available  during my care of the patient were reviewed by me and considered in my medical decision making (see chart for details).    Last EGFR was greater than 60.  Augmentin  is sent in for the dental infection.  Toradol injection is given here and Toradol tablets are sent to the pharmacy for the jaw pain and for the back pain.  Tizanidine muscle relaxer is also sent in for nighttime use  Final Clinical Impressions(s) / UC Diagnoses   Final diagnoses:  Chronic bilateral low back pain with left-sided sciatica  Dental infection     Discharge Instructions      You have been given a shot of Toradol 30 mg today.  Ketorolac 10 mg tablets--take 1 tablet every 6 hours as needed for pain.  This is the same medicine that is in the shot we just gave you   Take tizanidine 4 mg--1 every 8 hours as needed for muscle spasms; this medication can cause dizziness and sleepiness  Take amoxicillin-clavulanate 875 mg--1 tab twice daily with food for 7 days; this is the antibiotic for your mouth infection.      ED Prescriptions     Medication Sig Dispense Auth. Provider   ketorolac (TORADOL) 10 MG tablet Take 1 tablet (10 mg total) by mouth every 6 (six) hours as needed (pain). 20 tablet Aarin Bluett, Janace Aris, MD   tiZANidine (ZANAFLEX) 4 MG tablet Take 1 tablet (4 mg total) by mouth every 8 (eight) hours as needed for muscle spasms. 15 tablet Zenia Resides, MD   amoxicillin-clavulanate (AUGMENTIN) 875-125 MG tablet Take 1 tablet by mouth 2 (two) times daily for 7 days. 14 tablet Evadne Ose, Janace Aris, MD      PDMP not reviewed this encounter.   Zenia Resides, MD 01/07/23 1417    Zenia Resides, MD 01/07/23 [phone]

## 2023-01-07 NOTE — ED Triage Notes (Signed)
Pt states he has left lower back pain he has been seen multiple times for this. He is requesting a shot like the other times.   He states he is having bottom left dental pain X 1 week. He lost a filling.    He isnt taking any meds for his sx. He has flexeril but he hasn't been taking it since he doesn't like taking meds.

## 2023-01-07 NOTE — Discharge Instructions (Signed)
You have been given a shot of Toradol 30 mg today.  Ketorolac 10 mg tablets--take 1 tablet every 6 hours as needed for pain.  This is the same medicine that is in the shot we just gave you   Take tizanidine 4 mg--1 every 8 hours as needed for muscle spasms; this medication can cause dizziness and sleepiness  Take amoxicillin-clavulanate 875 mg--1 tab twice daily with food for 7 days; this is the antibiotic for your mouth infection.

## 2023-05-31 ENCOUNTER — Encounter (HOSPITAL_COMMUNITY): Payer: Self-pay

## 2023-05-31 ENCOUNTER — Ambulatory Visit (HOSPITAL_COMMUNITY)
Admission: EM | Admit: 2023-05-31 | Discharge: 2023-05-31 | Disposition: A | Discharge status: Home / Self Care | Attending: Emergency Medicine | Admitting: Emergency Medicine

## 2023-05-31 DIAGNOSIS — M25562 Pain in left knee: Secondary | ICD-10-CM | POA: Diagnosis not present

## 2023-05-31 DIAGNOSIS — G8929 Other chronic pain: Secondary | ICD-10-CM

## 2023-05-31 DIAGNOSIS — M5442 Lumbago with sciatica, left side: Secondary | ICD-10-CM

## 2023-05-31 MED ORDER — PREDNISONE 10 MG PO TABS: ORAL_TABLET | ORAL | 0 refills | Status: DC

## 2023-05-31 MED ORDER — KETOROLAC TROMETHAMINE 30 MG/ML IJ SOLN
INTRAMUSCULAR | Status: AC
  Filled 2023-05-31: qty 1

## 2023-05-31 MED ORDER — KETOROLAC TROMETHAMINE 30 MG/ML IJ SOLN
30.0000 mg | Freq: Once | INTRAMUSCULAR | Status: AC
  Administered 2023-05-31: 30 mg via INTRAMUSCULAR

## 2023-05-31 NOTE — ED Provider Notes (Signed)
 [facility]    CSN: [id number] Arrival date & time: 05/31/23  1738      History   Chief Complaint Chief Complaint  Patient presents with   Back Pain   Knee Pain    HPI Matthew Montgomery is a 63 y.o. male.   Patient presents with chronic left lower back pain that radiates down to his left foot.  Patient also reports chronic left knee pain x 1 year.  Patient states that he has been seen multiple times for chronic pain and given an injection and steroids that have provided him relief in the past.  Patient denies any new injuries or falls.     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 No pertinent surgical history.     Home Medications    Prior to Admission medications   Medication Sig Start Date End Date Taking? Authorizing Provider  predniSONE (DELTASONE) 10 MG tablet Take 4 tabs by mouth daily for 3 days, then 3 tabs for 3 days, then 2 tabs for 2 days, then 1 tab for 2 days 05/31/23  Yes Wynonia Lawman A, NP  Blood Glucose Monitoring Suppl (TRUE METRIX METER) w/Device KIT Use as instructed. Check blood glucose level by fingerstick twice per day. Will pick up today. Patient not taking: Reported on 11/19/2020 03/27/19   Claiborne Rigg, NP  cyclobenzaprine (FLEXERIL) 10 MG tablet Take 1 tablet (10 mg total) by mouth at bedtime. 09/03/22   White, Elita Boone,  NP  docusate sodium (COLACE) 100 MG capsule Take 1 capsule (100 mg total) by mouth 2 (two) times daily. Patient not taking: Reported on 11/19/2020 10/31/20   Ivonne Andrew, NP  sodium chloride (OCEAN) 0.65 % SOLN nasal spray Place 1 spray into both nostrils as needed for congestion. Patient not taking: Reported on 11/19/2020 08/08/19   Darr, Gerilyn Pilgrim, PA-C  tiZANidine (ZANAFLEX) 4 MG tablet Take 1 tablet (4 mg total) by mouth every 8 (eight) hours as needed for muscle spasms. 01/07/23   Zenia Resides, MD  TRUEplus Lancets 28G MISC Use as instructed. Check blood glucose level by fingerstick twice per day. Will pick up today. Patient not taking: Reported on 11/19/2020 03/27/19   Claiborne Rigg, NP    Family History Family History  Problem Relation Age of Onset   Hypertension Mother    Diabetes Mother    Diabetes Father    Cancer Father     Social History Social History   Tobacco Use   Smoking status: Every Day    Current packs/day: 0.50    Types: Cigarettes   Smokeless tobacco: Never  Vaping Use   Vaping status: Never Used  Substance Use Topics   Alcohol use: Yes    Comment: socially   Drug use: Yes    Types: Marijuana     Allergies   Patient has no known allergies.  Review of Systems Review of Systems  Musculoskeletal:  Positive for back pain.   Per HPI  Physical Exam Triage Vital Signs ED Triage Vitals  Encounter Vitals Group     BP 05/31/23 1859 121/78     Systolic BP Percentile --      Diastolic BP Percentile --      Pulse Rate 05/31/23 1859 (!) 59     Resp 05/31/23 1859 16     Temp 05/31/23 1859 (!) 97.5 F (36.4 C)     Temp Source 05/31/23 1859 Oral     SpO2 05/31/23 1859 98 %     Weight --      Height --      Head Circumference --      Peak Flow --      Pain Score 05/31/23 1858 9     Pain Loc --      Pain Education --      Exclude from Growth Chart --    No data found.  Updated Vital Signs BP 121/78 (BP Location: Right Arm)   Pulse (!) 59    Temp (!) 97.5 F (36.4 C) (Oral)   Resp 16   SpO2 98%   Visual Acuity Right Eye Distance:   Left Eye Distance:   Bilateral Distance:    Right Eye Near:   Left Eye Near:    Bilateral Near:     Physical Exam Vitals and nursing note reviewed.  Constitutional:      General: He is awake. He is not in acute distress.    Appearance: Normal appearance. He is well-developed and well-groomed. He is not ill-appearing.  Musculoskeletal:     Lumbar back: Tenderness present. Normal range of motion. Positive left straight leg raise test.     Left knee: No swelling or deformity. Normal range of motion. Tenderness present.  Neurological:     Mental Status: He is alert.  Psychiatric:        Behavior: Behavior is cooperative.      UC Treatments / Results  Labs (all labs ordered are listed, but only abnormal results are displayed) Labs Reviewed - No data to display  EKG   Radiology No results found.  Procedures Procedures (including critical care time)  Medications Ordered in UC Medications  ketorolac (TORADOL) 30 MG/ML injection 30 mg (30 mg Intramuscular Given 05/31/23 1929)    Initial Impression / Assessment and Plan / UC Course  I have reviewed the triage vital signs and the nursing notes.  Pertinent labs & imaging results that were available during my care of the patient were reviewed by me and considered in my medical decision making (see chart for details).     Upon assessment tenderness noted to left low back and soft tissue surrounding left knee.  Positive left SLE present.  Given injection of Toradol in clinic today.  Prescribed prednisone taper to assist with pain.  Discussed over-the-counter medication for pain.  Given orthopedic follow-up.  Discussed return precautions. Final Clinical Impressions(s) / UC Diagnoses   Final diagnoses:  Chronic left-sided low back pain with left-sided sciatica  Chronic pain of left knee     Discharge Instructions      You  were given an injection of Toradol here today in clinic to assist with your pain. Start taking prednisone taper over the next 10 days to help with inflammation that is causing your pain. Otherwise you can take 500 to 1000 mg of Tylenol every 4-6 hours as needed.  Do not exceed 4000 mg of Tylenol in a day. Alternate between ice and heat as needed for pain. I have attached [facility] that you can follow-up with if your pain persist with a can provide further management and evaluation of your chronic pain. Return here as needed.    ED Prescriptions     Medication Sig Dispense Auth. Provider   predniSONE (DELTASONE) 10 MG tablet Take 4 tabs by mouth daily for 3 days, then 3 tabs for 3 days, then 2 tabs for 2 days, then 1 tab for 2 days 28 tablet Wynonia Lawman A, NP      PDMP not reviewed this encounter.   Wynonia Lawman A, NP 05/31/23 2036

## 2023-05-31 NOTE — ED Triage Notes (Signed)
 Patient reports that he has left lower back pain tht radiates down to this left foot. Patient states he also has pain of the left knee x1 year and all pain is worse today.  Patient states he has not taken any medications for his symptoms.

## 2023-05-31 NOTE — Discharge Instructions (Addendum)
 You were given an injection of Toradol here today in clinic to assist with your pain. Start taking prednisone taper over the next 10 days to help with inflammation that is causing your pain. Otherwise you can take 500 to 1000 mg of Tylenol every 4-6 hours as needed.  Do not exceed 4000 mg of Tylenol in a day. Alternate between ice and heat as needed for pain. I have attached [facility] that you can follow-up with if your pain persist with a can provide further management and evaluation of your chronic pain. Return here as needed.

## 2023-06-22 ENCOUNTER — Emergency Department (HOSPITAL_COMMUNITY)

## 2023-06-22 ENCOUNTER — Other Ambulatory Visit: Payer: Self-pay

## 2023-06-22 ENCOUNTER — Encounter (HOSPITAL_COMMUNITY): Payer: Self-pay | Admitting: Emergency Medicine

## 2023-06-22 ENCOUNTER — Emergency Department (HOSPITAL_COMMUNITY)
Admission: EM | Admit: 2023-06-22 | Discharge: 2023-06-22 | Discharge status: Against Medical Advice | Attending: Emergency Medicine | Admitting: Emergency Medicine

## 2023-06-22 DIAGNOSIS — R072 Precordial pain: Secondary | ICD-10-CM | POA: Insufficient documentation

## 2023-06-22 DIAGNOSIS — R079 Chest pain, unspecified: Secondary | ICD-10-CM | POA: Diagnosis present

## 2023-06-22 DIAGNOSIS — R0602 Shortness of breath: Secondary | ICD-10-CM | POA: Insufficient documentation

## 2023-06-22 DIAGNOSIS — Z5321 Procedure and treatment not carried out due to patient leaving prior to being seen by health care provider: Secondary | ICD-10-CM | POA: Diagnosis not present

## 2023-06-22 LAB — CBC
HCT: 44.2 % (ref 39.0–52.0)
Hemoglobin: 14.8 g/dL (ref 13.0–17.0)
MCH: 31.6 pg (ref 26.0–34.0)
MCHC: 33.5 g/dL (ref 30.0–36.0)
MCV: 94.2 fL (ref 80.0–100.0)
Platelets: 263 10*3/uL (ref 150–400)
RBC: 4.69 MIL/uL (ref 4.22–5.81)
RDW: 12.5 % (ref 11.5–15.5)
WBC: 8.3 10*3/uL (ref 4.0–10.5)
nRBC: 0 % (ref 0.0–0.2)

## 2023-06-22 LAB — BASIC METABOLIC PANEL WITH GFR
Anion gap: 10 (ref 5–15)
BUN: 15 mg/dL (ref 6–20)
CO2: 24 mmol/L (ref 22–32)
Calcium: 8.8 mg/dL — ABNORMAL LOW (ref 8.9–10.3)
Chloride: 104 mmol/L (ref 98–111)
Creatinine, Ser: 1.07 mg/dL (ref 0.61–1.24)
GFR, Estimated: >60 mL/min (ref >60)
Glucose, Bld: 94 mg/dL (ref 70–99)
Potassium: 4.1 mmol/L (ref 3.5–5.1)
Sodium: 138 mmol/L (ref 135–145)

## 2023-06-22 LAB — TROPONIN I (HIGH SENSITIVITY): Troponin I (High Sensitivity): 2 ng/L (ref <18)

## 2023-06-22 NOTE — ED Triage Notes (Signed)
 Pt BIB by EMS from work for sudden onset of mid-sternal CP and intermittent SHOB. 324 mg ASA and nitroglycerin given en route. Pain 3/10 at this time.

## 2023-06-22 NOTE — ED Provider Triage Note (Signed)
 Emergency Medicine Provider Triage Evaluation Note  Rui Tomek , a 63 y.o. male  was evaluated in triage.  Pt complains of chest pain.  Pt given asa and nitroglycerin by ems  Review of Systems  Positive: Chest pain Negative: cough  Physical Exam  BP 127/78 (BP Location: Right Arm)   Pulse 60   Temp 98.2 F (36.8 C) (Oral)   Resp 18   SpO2 100%  Gen:   Awake, no distress   Resp:  Normal effort  MSK:   Moves extremities without difficulty  Other:    Medical Decision Making  Medically screening exam initiated at 4:57 PM.  Appropriate orders placed.  Carlester Days Devereux was informed that the remainder of the evaluation will be completed by another provider, this initial triage assessment does not replace that evaluation, and the importance of remaining in the ED until their evaluation is complete.     Sandi Crosby, PA-C 06/22/23 2156

## 2023-06-22 NOTE — ED Notes (Signed)
 Pt stated they wanted to leave and have their IV removed, and pt was encouraged to stay and pt left anyway.

## 2023-12-08 IMAGING — DX DG CHEST 1V PORT
1 series · 1 of 1 positions shown · non-contrast
Comparison: 08/30/2020

CLINICAL DATA: Sharp left-sided chest pain

EXAM:
PORTABLE CHEST 1 VIEW

[chest ap]
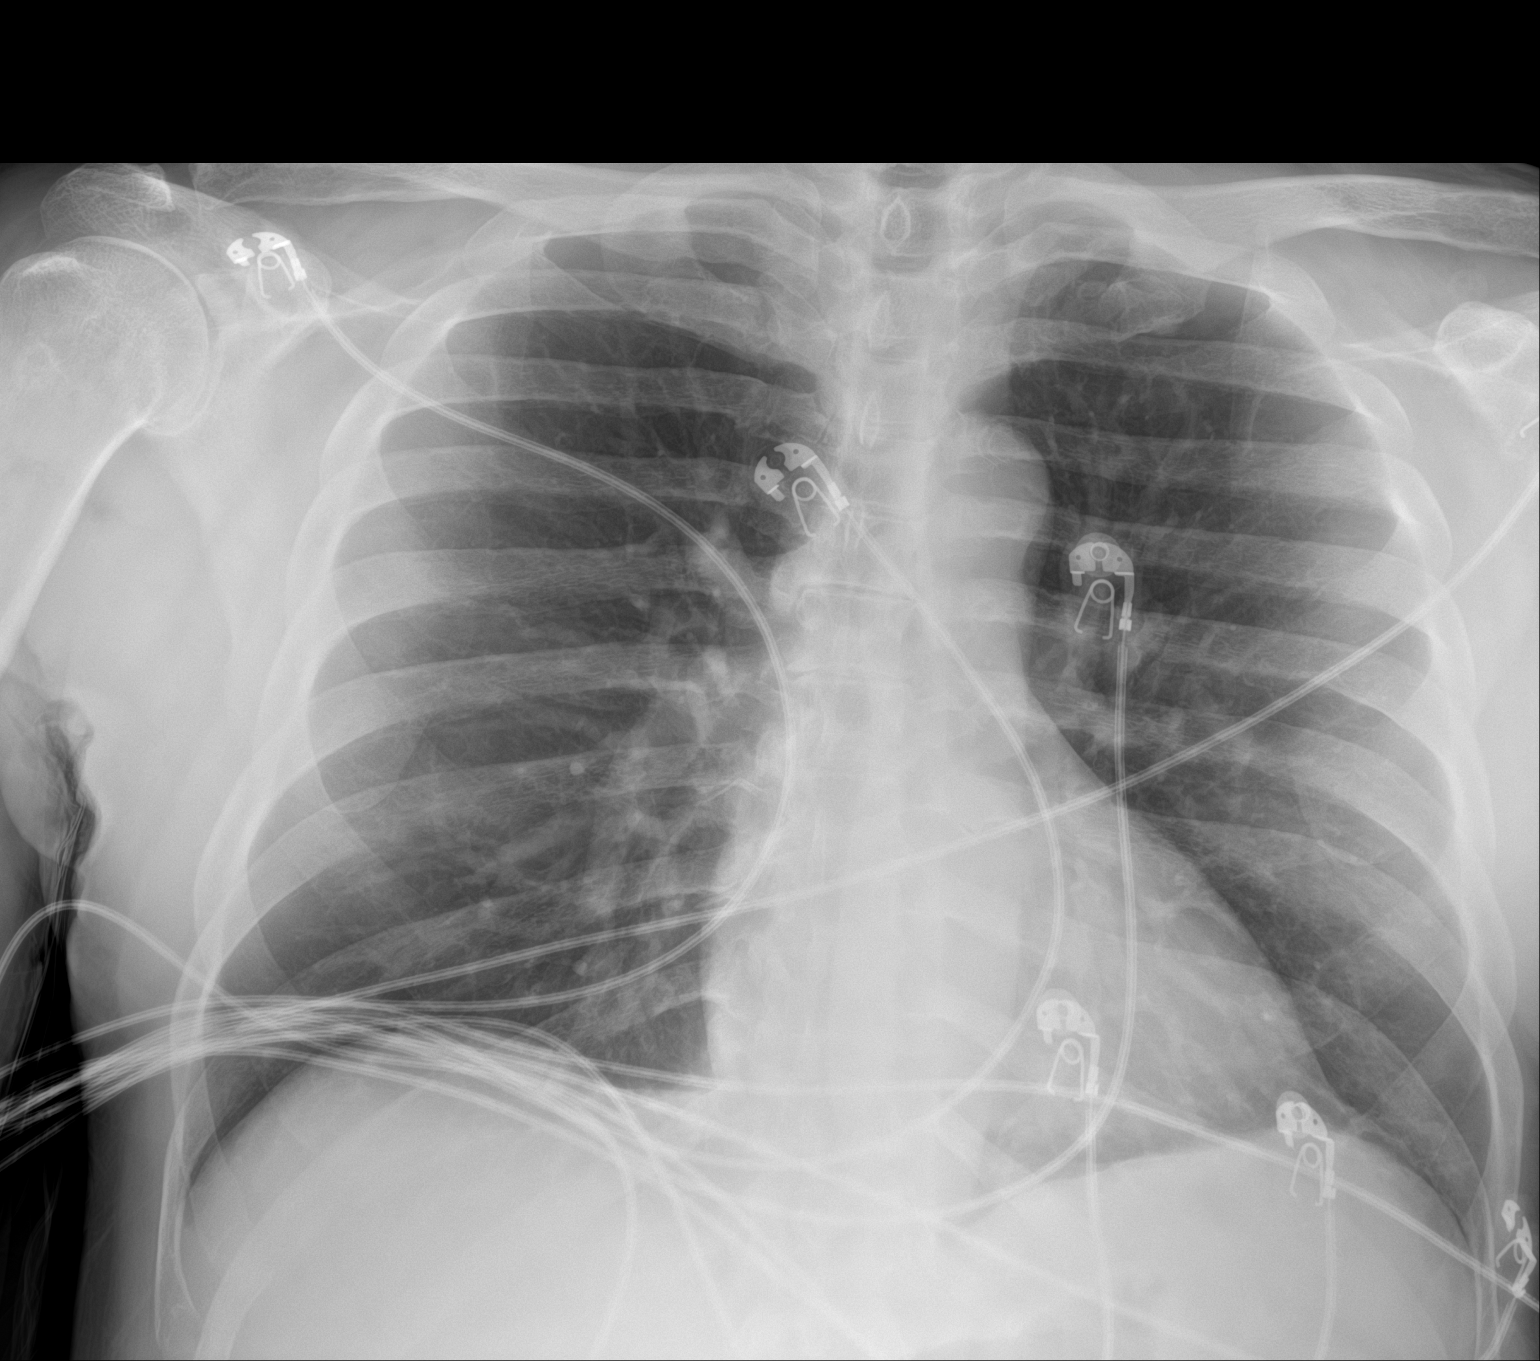

[1 of 1 positions shown; findings below may reference images not displayed]

FINDINGS: The heart size and mediastinal contours are within normal limits.
Both lungs are clear. The visualized skeletal structures are
unremarkable.
IMPRESSION: No active disease.

## 2024-02-21 ENCOUNTER — Ambulatory Visit: Payer: Self-pay

## 2024-02-21 NOTE — Telephone Encounter (Signed)
 FYI Only or Action Required?: FYI only for provider: appointment scheduled on 03/29/23.  Patient was last seen in primary care on N/A.  Called Nurse Triage reporting Back Pain.  Symptoms began several years ago.  Interventions attempted: Other: urgent care.  Symptoms are: gradually worsening.  Triage Disposition: See Physician Within 24 Hours- advised urgent care or [facility]  Patient/caregiver understands and will follow disposition?: Yes         Copied from CRM #[id number]. Topic: Clinical - Red Word Triage >> Feb 21, 2024 10:25 AM Lonell PEDLAR wrote: Red Word that prompted transfer to Nurse Triage: back pain, radiating down to right leg. Looking to est care with Dr. Tanda at [facility]    ----------------------------------------------------------------------- From previous Reason for Contact - Scheduling: Patient/patient representative is calling to schedule an appointment. Refer to attachments for appointment information. Reason for Disposition  Numbness in a leg or foot (i.e., loss of sensation)  Answer Assessment - Initial Assessment Questions 1. ONSET: When did the pain begin? (e.g., minutes, hours, days)     Years.  2. LOCATION: Where does it hurt? (upper, mid or lower back)     Lower.  3. SEVERITY: How bad is the pain?  (e.g., Scale 1-10; mild, moderate, or severe)     9/10.  4. PATTERN: Is the pain constant? (e.g., yes, no; constant, intermittent)      Constant.  5. RADIATION: Does the pain shoot into your legs or somewhere else?     Right leg.  6. CAUSE:  What do you think is causing the back pain?      He states in '96-97 he injured his back and was supposed to have surgery but didn't. He states he thinks it is a slipped disc pinching a nerve.  7. BACK OVERUSE:  Any recent lifting of heavy objects, strenuous work or exercise?     No recent injury but states he had a previous injury. 8. MEDICINES: What have you taken so far for the  pain? (e.g., nothing, acetaminophen , NSAIDS)     None, he states he goes to urgent care and gets an injection that lasts about a month for the pain. Per chart review patient receives toradol  shot and prednisone  taper.  9. NEUROLOGIC SYMPTOMS: Do you have any weakness, numbness, or problems with bowel/bladder control?     Numbness in right foot.  10. OTHER SYMPTOMS: Do you have any other symptoms? (e.g., fever, abdomen pain, burning with urination, blood in urine)       Pelvic pain x years.  Protocols used: Back Pain-A-AH

## 2024-03-06 ENCOUNTER — Encounter (HOSPITAL_COMMUNITY): Payer: Self-pay

## 2024-03-06 ENCOUNTER — Ambulatory Visit (HOSPITAL_COMMUNITY)
Admission: EM | Admit: 2024-03-06 | Discharge: 2024-03-06 | Disposition: A | Discharge status: Home / Self Care | Attending: Emergency Medicine | Admitting: Emergency Medicine

## 2024-03-06 DIAGNOSIS — G8929 Other chronic pain: Secondary | ICD-10-CM

## 2024-03-06 DIAGNOSIS — M5442 Lumbago with sciatica, left side: Secondary | ICD-10-CM

## 2024-03-06 DIAGNOSIS — M5441 Lumbago with sciatica, right side: Secondary | ICD-10-CM

## 2024-03-06 MED ORDER — KETOROLAC TROMETHAMINE 30 MG/ML IJ SOLN
30.0000 mg | Freq: Once | INTRAMUSCULAR | Status: AC
  Administered 2024-03-06: 30 mg via INTRAMUSCULAR

## 2024-03-06 MED ORDER — LIDOCAINE 5 % EX PTCH: 1.0000 | MEDICATED_PATCH | CUTANEOUS | 0 refills | Status: AC

## 2024-03-06 MED ORDER — KETOROLAC TROMETHAMINE 30 MG/ML IJ SOLN
INTRAMUSCULAR | Status: AC
  Filled 2024-03-06: qty 1

## 2024-03-06 MED ORDER — PREDNISONE 20 MG PO TABS: 40.0000 mg | ORAL_TABLET | Freq: Every day | ORAL | 0 refills | Status: AC

## 2024-03-06 NOTE — Discharge Instructions (Signed)
 You are given an injection of Toradol  in clinic today to help with your back pain. Start taking 2 tablets of prednisone  once daily for 5 days for additional relief of this. You can alternate between 500 to 1000 mg of Tylenol  and 400 to 600 mg of ibuprofen  every 6-8 hours as needed for pain. You can apply lidocaine  patch for 12 hours at a time for additional pain relief.  Also alternate between ice and heat as needed for pain. Follow-up with your primary care provider or return here as needed.

## 2024-03-06 NOTE — ED Triage Notes (Signed)
 Pt has c/o lower back x 1 month, and stated that is chronic back pain that has been getting worse. Pt denies injury to back, and denies taking pain medications at home.

## 2024-03-06 NOTE — ED Provider Notes (Signed)
 " [facility]    CSN: [id number] Arrival date & time: 03/06/24  1736      History   Chief Complaint Chief Complaint  Patient presents with   Back Pain    HPI Matthew Montgomery is a 64 y.o. male.   Patient presents with chronic low back pain.  Patient states that he is currently experiencing bilateral low back pain that radiates down both of his legs.  Patient states that this is consistent with his chronic back pain.  Patient denies any recent falls or known injuries.  Patient denies taking medication at home for his pain.  Patient reports in the past he has received an injection of Toradol  and prednisone  at home with some relief of his chronic pain.  Patient reports that he does have an appointment with a primary care provider in February to further evaluate his chronic back pain.  Patient denies any saddle anesthesia or bowel/bladder incontinence.  The history is provided by the patient and medical records.  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 No pertinent surgical history.     Home Medications    Prior to Admission medications  Medication Sig Start Date End Date Taking? Authorizing Provider  lidocaine  (LIDODERM ) 5 % Place 1 patch onto the skin daily. Remove & Discard patch within 12 hours or as directed by MD 03/06/24  Yes Johnie Flaming A, NP  predniSONE  (DELTASONE ) 20 MG tablet Take 2 tablets  (40 mg total) by mouth daily for 5 days. 03/06/24 03/11/24 Yes Johnie Flaming LABOR, NP    Family History Family History  Problem Relation Age of Onset   Hypertension Mother    Diabetes Mother    Diabetes Father    Cancer Father     Social History Social History[1]   Allergies   Patient has no known allergies.   Review of Systems Review of Systems  Musculoskeletal:  Positive for back pain.   Per HPI  Physical Exam Triage Vital Signs ED Triage Vitals  Encounter Vitals Group     BP 03/06/24 1922 130/87     Girls Systolic BP Percentile --      Girls Diastolic BP Percentile --      Boys Systolic BP Percentile --      Boys Diastolic BP Percentile --      Pulse Rate 03/06/24 1922 68     Resp 03/06/24 1922 16     Temp 03/06/24 1922 98.5 F (36.9 C)     Temp Source 03/06/24 1922 Oral     SpO2 03/06/24 1922 96 %     Weight --      Height --      Head Circumference --      Peak Flow --      Pain Score 03/06/24 1919 9     Pain Loc --      Pain Education --      Exclude  from Growth Chart --    No data found.  Updated Vital Signs BP 130/87 (BP Location: Right Arm)   Pulse 68   Temp 98.5 F (36.9 C) (Oral)   Resp 16   SpO2 96%   Visual Acuity Right Eye Distance:   Left Eye Distance:   Bilateral Distance:    Right Eye Near:   Left Eye Near:    Bilateral Near:     Physical Exam Vitals and nursing note reviewed.  Constitutional:      General: He is awake. He is not in acute distress.    Appearance: Normal appearance. He is well-developed and well-groomed. He is not ill-appearing.  Musculoskeletal:     Cervical back: Normal.     Thoracic back: Normal.     Lumbar back: Tenderness present. No swelling, edema, deformity, signs of trauma or bony tenderness. Normal range of motion. Positive left straight leg raise test. Negative right straight leg raise test.       Back:     Comments: Tenderness noted to bilateral low back without spinous process tenderness.   Skin:    General: Skin is warm and dry.  Neurological:     Mental Status: He is alert.  Psychiatric:        Behavior: Behavior is cooperative.      UC Treatments / Results  Labs (all labs ordered are listed, but only abnormal results are displayed) Labs Reviewed - No data to display  EKG   Radiology No results found.  Procedures Procedures (including critical care time)  Medications Ordered in UC Medications  ketorolac  (TORADOL ) 30 MG/ML injection 30 mg (has no administration in time range)    Initial Impression / Assessment and Plan / UC Course  I have reviewed the triage vital signs and the nursing notes.  Pertinent labs & imaging results that were available during my care of the patient were reviewed by me and considered in my medical decision making (see chart for details).     Patient is overall well-appearing.  Vitals are stable.  Pain consistent with chronic back pain with bilateral sciatica.  Given IM Toradol  in clinic for acute pain.  Prescribed prednisone  for additional relief.  Prescribed lidocaine  patches for pain relief as well.  Recommended Tylenol  and ibuprofen  as needed for pain.  Discussed follow-up and return precautions. Final Clinical Impressions(s) / UC Diagnoses   Final diagnoses:  Chronic bilateral low back pain with bilateral sciatica     Discharge Instructions      You are given an injection of Toradol  in clinic today to help with your back pain. Start taking 2 tablets of prednisone  once daily for 5 days for additional relief of this. You can alternate between 500 to 1000 mg of Tylenol  and 400 to 600 mg of ibuprofen  every 6-8 hours as needed for pain. You can apply lidocaine  patch for 12 hours at a time for additional pain relief.  Also alternate between ice and heat as needed for pain. Follow-up with your primary care provider or return here as needed.     ED Prescriptions     Medication Sig Dispense Auth. Provider   predniSONE   (DELTASONE ) 20 MG tablet Take 2 tablets (40 mg total) by mouth daily for 5 days. 10 tablet Johnie Flaming A, NP   lidocaine  (LIDODERM ) 5 % Place 1 patch onto the skin daily. Remove & Discard patch within 12 hours or as directed by MD 30 patch Johnie Flaming LABOR, NP  PDMP not reviewed this encounter.    [1]  Social History Tobacco Use   Smoking status: Every Day    Current packs/day: 0.50    Types: Cigarettes   Smokeless tobacco: Never  Vaping Use   Vaping status: Never Used  Substance Use Topics   Alcohol use: Yes    Comment: socially   Drug use: Yes    Types: Marijuana     Vere, Diantonio, NP 03/06/24 1944  "

## 2024-03-28 ENCOUNTER — Ambulatory Visit: Payer: Self-pay

## 2024-03-30 ENCOUNTER — Encounter (HOSPITAL_COMMUNITY): Payer: Self-pay

## 2024-03-30 ENCOUNTER — Ambulatory Visit (HOSPITAL_COMMUNITY): Payer: Self-pay | Admitting: Nurse Practitioner

## 2024-03-30 ENCOUNTER — Ambulatory Visit (INDEPENDENT_AMBULATORY_CARE_PROVIDER_SITE_OTHER)

## 2024-03-30 ENCOUNTER — Ambulatory Visit (HOSPITAL_COMMUNITY)
Admission: EM | Admit: 2024-03-30 | Discharge: 2024-03-30 | Disposition: A | Discharge status: Home / Self Care | Source: Home / Self Care

## 2024-03-30 DIAGNOSIS — R051 Acute cough: Secondary | ICD-10-CM | POA: Diagnosis not present

## 2024-03-30 DIAGNOSIS — J069 Acute upper respiratory infection, unspecified: Secondary | ICD-10-CM

## 2024-03-30 LAB — POC SOFIA SARS ANTIGEN FIA: SARS Coronavirus 2 Ag: NEGATIVE

## 2024-03-30 MED ORDER — BENZONATATE 100 MG PO CAPS: 100.0000 mg | ORAL_CAPSULE | Freq: Three times a day (TID) | ORAL | 0 refills | Status: AC | PRN

## 2024-03-30 NOTE — ED Provider Notes (Signed)
 " [facility]    CSN: [id number] Arrival date & time: 03/30/24  1227      History   Chief Complaint Chief Complaint  Patient presents with   Fever    HPI Matthew Montgomery is a 64 y.o. male.   Patient presents today with 5 to 6-day history of bodyaches and chills, congested and dry cough, shortness of breath, chest pain when he coughs, runny and stuffy nose, sore throat, headache, and left ear ringing.  He also endorses diarrhea and decreased appetite.  No abdominal pain, nausea, vomiting.  Reports his granddaughter was recently treated for a touch of pneumonia.  Has been taking TheraFlu without improvement.  Patient reports he smokes cigarettes but not like that.  He denies daily smoking.  Reports he has never needed inhalers in the past for his breathing.    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 No pertinent surgical history.     Home Medications    Prior to Admission medications  Medication Sig Start Date End Date Taking? Authorizing Provider  benzonatate  (TESSALON ) 100 MG capsule Take 1 capsule (100 mg total) by mouth 3 (three) times daily as needed for cough. Do not take with alcohol or while operating or driving heavy machinery 08/27/71  Yes Chandra Raisin A, NP  lidocaine  (LIDODERM ) 5 % Place 1 patch onto the skin daily. Remove & Discard patch within 12 hours or as directed by MD 03/06/24   Johnie Rumaldo LABOR, NP    Family History Family History  Problem Relation Age of Onset   Hypertension Mother    Diabetes Mother    Diabetes Father    Cancer Father     Social History Social History[1]   Allergies   Patient has no known allergies.   Review of Systems Review of Systems Per HPI  Physical Exam Triage Vital Signs ED Triage Vitals  Encounter Vitals Group     BP 03/30/24 1244 136/85     Girls Systolic BP Percentile --      Girls Diastolic BP Percentile --      Boys Systolic BP Percentile --      Boys Diastolic BP Percentile --      Pulse Rate 03/30/24 1244 78     Resp 03/30/24 1244 16     Temp 03/30/24 1244 99.1 F (37.3 C)     Temp Source 03/30/24 1244 Oral     SpO2 03/30/24 1244 96 %     Weight --      Height --      Head Circumference --      Peak Flow --      Pain Score 03/30/24 1243 0     Pain Loc --      Pain Education --      Exclude from Growth Chart --    No data found.  Updated Vital Signs BP 136/85 (BP Location: Left Arm)   Pulse 78  Temp 99.1 F (37.3 C) (Oral)   Resp 16   SpO2 96%   Visual Acuity Right Eye Distance:   Left Eye Distance:   Bilateral Distance:    Right Eye Near:   Left Eye Near:    Bilateral Near:     Physical Exam Vitals and nursing note reviewed.  Constitutional:      General: He is not in acute distress.    Appearance: Normal appearance. He is not ill-appearing or toxic-appearing.  HENT:     Head: Normocephalic and atraumatic.     Right Ear: Tympanic membrane, ear canal and external ear normal.     Left Ear: Tympanic membrane, ear canal and external ear normal.     Nose: Congestion present. No rhinorrhea.     Mouth/Throat:     Mouth: Mucous membranes are moist.     Pharynx: Oropharynx is clear. Postnasal drip present. No oropharyngeal exudate or posterior oropharyngeal erythema.  Eyes:     General: No scleral icterus.    Extraocular Movements: Extraocular movements intact.  Cardiovascular:     Rate and  Rhythm: Normal rate and regular rhythm.  Pulmonary:     Effort: Pulmonary effort is normal. No respiratory distress.     Breath sounds: Normal breath sounds. No wheezing, rhonchi or rales.  Musculoskeletal:     Cervical back: Normal range of motion and neck supple.  Lymphadenopathy:     Cervical: No cervical adenopathy.  Skin:    General: Skin is warm and dry.     Coloration: Skin is not jaundiced or pale.     Findings: No erythema or rash.  Neurological:     Mental Status: He is alert and oriented to person, place, and time.  Psychiatric:        Behavior: Behavior is cooperative.      UC Treatments / Results  Labs (all labs ordered are listed, but only abnormal results are displayed) Labs Reviewed  POC SOFIA SARS ANTIGEN FIA    EKG   Radiology DG Chest 2 View Result Date: 03/30/2024 CLINICAL DATA:  Cough EXAM: CHEST - 2 VIEW COMPARISON:  06/22/2023 FINDINGS: Midline trachea. Normal heart size and mediastinal contours. No pleural effusion or pneumothorax. Diffuse peribronchial thickening. IMPRESSION: 1. No acute cardiopulmonary disease. 2. Peribronchial thickening which may relate to chronic bronchitis or smoking. Electronically Signed   By: Rockey Kilts M.D.   On: 03/30/2024 13:39    Procedures Procedures (including critical care time)  Medications Ordered in UC Medications - No data to display  Initial Impression / Assessment and Plan / UC Course  I have reviewed the triage vital signs and the nursing notes.  Pertinent labs & imaging results that were available during my care of the patient were reviewed by me and considered in my medical decision making (see chart for details).   Patient is a 64 year old male with stable vital signs presenting today for cough and congestion.  Given length of symptoms, influenza testing deferred.  COVID-19 testing is negative today.  Suspect viral etiology.  Supportive care discussed.  ER and return precautions discussed.  Work excuse  provided.  The patient was given the opportunity to ask questions.  All questions answered to their satisfaction.  The patient is in agreement to this plan.   Final Clinical Impressions(s) / UC Diagnoses   Final diagnoses:  Acute cough  Viral URI with cough     Discharge Instructions      You have a viral upper respiratory infection.  COVID-19 testing is negative today.  Symptoms should improve over the next week to 10 days.  If you develop chest pain or shortness of breath, go to the emergency room.  Some things that can make you feel better are: - Increased rest - Increasing fluid with water/sugar free electrolytes - Acetaminophen  and ibuprofen  as needed for fever/pain - Salt water gargling, chloraseptic spray and throat lozenges - OTC guaifenesin  (Mucinex ) 600 mg twice daily - Saline sinus flushes or a neti pot - Humidifying the air -Tessalon  Perles every 8 hours as needed for dry cough     ED Prescriptions     Medication Sig Dispense Auth. Provider   benzonatate  (TESSALON ) 100 MG capsule Take 1 capsule (100 mg total) by mouth 3 (three) times daily as needed for cough. Do not take with alcohol or while operating or driving heavy machinery 21 capsule Chandra Harlene LABOR, NP      PDMP not reviewed this encounter.    [1]  Social History Tobacco Use   Smoking status: Every Day    Current packs/day: 0.50    Types: Cigarettes   Smokeless tobacco: Never  Vaping Use   Vaping status: Never Used  Substance Use Topics   Alcohol use: Yes    Comment: socially   Drug use: Yes    Types: Marijuana     Chandra Harlene LABOR, NP 03/30/24 1400  "

## 2024-03-30 NOTE — ED Triage Notes (Signed)
 Pt states fever,chills,sob,fatigue,chest congestion.  States it hurts to cough.  States he has been taking theraflu at home.

## 2024-03-30 NOTE — Discharge Instructions (Signed)
 You have a viral upper respiratory infection.  COVID-19 testing is negative today.  Symptoms should improve over the next week to 10 days.  If you develop chest pain or shortness of breath, go to the emergency room.  Some things that can make you feel better are: - Increased rest - Increasing fluid with water/sugar free electrolytes - Acetaminophen  and ibuprofen  as needed for fever/pain - Salt water gargling, chloraseptic spray and throat lozenges - OTC guaifenesin  (Mucinex ) 600 mg twice daily - Saline sinus flushes or a neti pot - Humidifying the air -Tessalon  Perles every 8 hours as needed for dry cough
